# Patient Record
Sex: Male | Born: 1975 | Race: White | Hispanic: No | Marital: Single | Smoking: Never smoker
Health system: Southern US, Community
[De-identification: ages and names within clinical notes are randomized; demographics above are authoritative.]

## PROBLEM LIST (undated history)

## (undated) DIAGNOSIS — T8611 Kidney transplant rejection: Secondary | ICD-10-CM

## (undated) DIAGNOSIS — J9690 Respiratory failure, unspecified, unspecified whether with hypoxia or hypercapnia: Secondary | ICD-10-CM

## (undated) DIAGNOSIS — I10 Essential (primary) hypertension: Secondary | ICD-10-CM

## (undated) DIAGNOSIS — Q8781 Alport syndrome: Secondary | ICD-10-CM

## (undated) DIAGNOSIS — M25449 Effusion, unspecified hand: Secondary | ICD-10-CM

## (undated) DIAGNOSIS — Z8619 Personal history of other infectious and parasitic diseases: Secondary | ICD-10-CM

## (undated) DIAGNOSIS — E119 Type 2 diabetes mellitus without complications: Secondary | ICD-10-CM

## (undated) DIAGNOSIS — D631 Anemia in chronic kidney disease: Secondary | ICD-10-CM

## (undated) DIAGNOSIS — R7881 Bacteremia: Secondary | ICD-10-CM

## (undated) DIAGNOSIS — N189 Chronic kidney disease, unspecified: Secondary | ICD-10-CM

## (undated) DIAGNOSIS — M109 Gout, unspecified: Secondary | ICD-10-CM

## (undated) DIAGNOSIS — Z940 Kidney transplant status: Secondary | ICD-10-CM

## (undated) HISTORY — PX: KIDNEY TRANSPLANT: SHX239

## (undated) HISTORY — PX: NEPHRECTOMY TRANSPLANTED ORGAN: SUR880

---

## 2005-02-12 ENCOUNTER — Emergency Department: Payer: Self-pay | Admitting: Emergency Medicine

## 2005-10-22 ENCOUNTER — Emergency Department: Payer: Self-pay | Admitting: Emergency Medicine

## 2006-08-13 ENCOUNTER — Emergency Department: Payer: Self-pay | Admitting: Emergency Medicine

## 2008-11-26 ENCOUNTER — Ambulatory Visit: Payer: Self-pay

## 2008-12-04 ENCOUNTER — Ambulatory Visit: Payer: Self-pay

## 2011-01-04 DIAGNOSIS — T8611 Kidney transplant rejection: Secondary | ICD-10-CM | POA: Insufficient documentation

## 2012-01-26 DIAGNOSIS — E139 Other specified diabetes mellitus without complications: Secondary | ICD-10-CM | POA: Insufficient documentation

## 2012-01-26 DIAGNOSIS — I10 Essential (primary) hypertension: Secondary | ICD-10-CM | POA: Insufficient documentation

## 2012-05-06 ENCOUNTER — Ambulatory Visit: Payer: Self-pay | Admitting: Internal Medicine

## 2012-05-08 ENCOUNTER — Inpatient Hospital Stay: Payer: Self-pay | Admitting: Internal Medicine

## 2012-05-08 LAB — CBC
HCT: 24 % — ABNORMAL LOW (ref 40.0–52.0)
HGB: 8.4 g/dL — ABNORMAL LOW (ref 13.0–18.0)
MCH: 29.8 pg (ref 26.0–34.0)
MCV: 86 fL (ref 80–100)
RDW: 13.7 % (ref 11.5–14.5)
WBC: 5.4 10*3/uL (ref 3.8–10.6)

## 2012-05-08 LAB — COMPREHENSIVE METABOLIC PANEL
Albumin: 2.5 g/dL — ABNORMAL LOW (ref 3.4–5.0)
Calcium, Total: 8.4 mg/dL — ABNORMAL LOW (ref 8.5–10.1)
Chloride: 110 mmol/L — ABNORMAL HIGH (ref 98–107)
Co2: 24 mmol/L (ref 21–32)
EGFR (African American): 33 — ABNORMAL LOW
EGFR (Non-African Amer.): 28 — ABNORMAL LOW
Glucose: 118 mg/dL — ABNORMAL HIGH (ref 65–99)
Osmolality: 298 (ref 275–301)
Potassium: 3.9 mmol/L (ref 3.5–5.1)
SGOT(AST): 80 U/L — ABNORMAL HIGH (ref 15–37)
SGPT (ALT): 75 U/L (ref 12–78)
Sodium: 142 mmol/L (ref 136–145)

## 2012-05-09 LAB — URINALYSIS, COMPLETE
Bilirubin,UR: NEGATIVE
Glucose,UR: 50 mg/dL (ref 0–75)
Granular Cast: 1
Hyaline Cast: 4
Ketone: NEGATIVE
Nitrite: NEGATIVE
RBC,UR: 4 /HPF (ref 0–5)
Squamous Epithelial: NONE SEEN
WBC UR: 2 /HPF (ref 0–5)

## 2012-05-09 LAB — CBC WITH DIFFERENTIAL/PLATELET
Basophil %: 0.7 %
Eosinophil %: 0.4 %
Lymphocyte %: 6.7 %
MCH: 29.1 pg (ref 26.0–34.0)
MCV: 87 fL (ref 80–100)
Monocyte #: 0.4 x10 3/mm (ref 0.2–1.0)
Monocyte %: 6.7 %
Neutrophil #: 4.8 10*3/uL (ref 1.4–6.5)
Platelet: 84 10*3/uL — ABNORMAL LOW (ref 150–440)
WBC: 5.7 10*3/uL (ref 3.8–10.6)

## 2012-05-09 LAB — BASIC METABOLIC PANEL
BUN: 47 mg/dL — ABNORMAL HIGH (ref 7–18)
Calcium, Total: 8.5 mg/dL (ref 8.5–10.1)
Chloride: 110 mmol/L — ABNORMAL HIGH (ref 98–107)
EGFR (Non-African Amer.): 28 — ABNORMAL LOW
Osmolality: 298 (ref 275–301)
Potassium: 4.8 mmol/L (ref 3.5–5.1)

## 2012-05-09 LAB — PROTEIN / CREATININE RATIO, URINE
Creatinine, Urine: 149.5 mg/dL — ABNORMAL HIGH (ref 30.0–125.0)
Protein, Random Urine: 625 mg/dL — ABNORMAL HIGH (ref 0–12)
Protein/Creat. Ratio: 4181 mg/gCREAT — ABNORMAL HIGH (ref 0–200)

## 2012-05-10 LAB — RETICULOCYTES: Reticulocyte: 1.8 % (ref 0.7–2.5)

## 2012-05-10 LAB — CBC WITH DIFFERENTIAL/PLATELET
Bands: 42 %
Basophil %: 0.8 %
Comment - H1-Com1: NORMAL
Eosinophil #: 0 10*3/uL (ref 0.0–0.7)
Eosinophil %: 0.5 %
HGB: 6 g/dL — ABNORMAL LOW (ref 13.0–18.0)
Lymphocyte %: 12.3 %
Lymphocytes: 17 %
MCH: 26.9 pg (ref 26.0–34.0)
MCHC: 31.1 g/dL — ABNORMAL LOW (ref 32.0–36.0)
Monocyte #: 0.2 x10 3/mm (ref 0.2–1.0)
Monocyte %: 6.8 %
Monocytes: 3 %
Neutrophil %: 79.6 %
Platelet: 64 10*3/uL — ABNORMAL LOW (ref 150–440)
RBC: 2.21 10*6/uL — ABNORMAL LOW (ref 4.40–5.90)

## 2012-05-10 LAB — BASIC METABOLIC PANEL
Calcium, Total: 8 mg/dL — ABNORMAL LOW (ref 8.5–10.1)
Co2: 23 mmol/L (ref 21–32)
Creatinine: 3.88 mg/dL — ABNORMAL HIGH (ref 0.60–1.30)
EGFR (African American): 22 — ABNORMAL LOW
EGFR (Non-African Amer.): 19 — ABNORMAL LOW
Glucose: 132 mg/dL — ABNORMAL HIGH (ref 65–99)
Osmolality: 292 (ref 275–301)
Sodium: 138 mmol/L (ref 136–145)

## 2012-05-11 LAB — BASIC METABOLIC PANEL
Anion Gap: 10 (ref 7–16)
BUN: 53 mg/dL — ABNORMAL HIGH (ref 7–18)
Calcium, Total: 8.3 mg/dL — ABNORMAL LOW (ref 8.5–10.1)
Chloride: 106 mmol/L (ref 98–107)
Creatinine: 3.41 mg/dL — ABNORMAL HIGH (ref 0.60–1.30)
EGFR (Non-African Amer.): 22 — ABNORMAL LOW
Glucose: 144 mg/dL — ABNORMAL HIGH (ref 65–99)
Osmolality: 294 (ref 275–301)
Potassium: 4.7 mmol/L (ref 3.5–5.1)

## 2012-05-11 LAB — PROTIME-INR: INR: 1.1

## 2012-05-11 LAB — CBC WITH DIFFERENTIAL/PLATELET
Basophil %: 0.5 %
Eosinophil #: 0 10*3/uL (ref 0.0–0.7)
Lymphocyte #: 0.4 10*3/uL — ABNORMAL LOW (ref 1.0–3.6)
Lymphocyte %: 9.3 %
MCH: 28.7 pg (ref 26.0–34.0)
MCHC: 34.1 g/dL (ref 32.0–36.0)
MCV: 84 fL (ref 80–100)
Monocyte #: 0.3 x10 3/mm (ref 0.2–1.0)
Neutrophil %: 84 %
Platelet: 81 10*3/uL — ABNORMAL LOW (ref 150–440)
RBC: 2.42 10*6/uL — ABNORMAL LOW (ref 4.40–5.90)
RDW: 14 % (ref 11.5–14.5)

## 2012-05-11 LAB — LACTATE DEHYDROGENASE: LDH: 327 U/L — ABNORMAL HIGH (ref 85–241)

## 2012-05-11 LAB — FIBRINOGEN: Fibrinogen: 624 mg/dL — ABNORMAL HIGH (ref 210–470)

## 2012-05-11 LAB — FOLATE: Folic Acid: 48 ng/mL (ref 3.1–100.0)

## 2012-05-11 LAB — FIBRIN DEGRADATION PROD.(ARMC ONLY): Fibrin Degradation Prod.: <10 ug/ml (ref 2.1–7.7)

## 2012-05-12 DIAGNOSIS — N179 Acute kidney failure, unspecified: Secondary | ICD-10-CM | POA: Insufficient documentation

## 2012-05-12 DIAGNOSIS — J9600 Acute respiratory failure, unspecified whether with hypoxia or hypercapnia: Secondary | ICD-10-CM | POA: Insufficient documentation

## 2012-05-12 LAB — CBC WITH DIFFERENTIAL/PLATELET
Basophil #: 0 10*3/uL (ref 0.0–0.1)
Basophil %: 0.2 %
Eosinophil #: 0 10*3/uL (ref 0.0–0.7)
Eosinophil %: 0.1 %
HGB: 7.6 g/dL — ABNORMAL LOW (ref 13.0–18.0)
Lymphocyte #: 0.2 10*3/uL — ABNORMAL LOW (ref 1.0–3.6)
Lymphocyte %: 3.2 %
Monocyte #: 0.3 x10 3/mm (ref 0.2–1.0)
Neutrophil %: 92.3 %
RBC: 2.66 10*6/uL — ABNORMAL LOW (ref 4.40–5.90)
RDW: 14.1 % (ref 11.5–14.5)

## 2012-05-12 LAB — BASIC METABOLIC PANEL
Anion Gap: 10 (ref 7–16)
Calcium, Total: 8.2 mg/dL — ABNORMAL LOW (ref 8.5–10.1)
Chloride: 106 mmol/L (ref 98–107)
Co2: 22 mmol/L (ref 21–32)
Creatinine: 3.66 mg/dL — ABNORMAL HIGH (ref 0.60–1.30)
EGFR (African American): 23 — ABNORMAL LOW
EGFR (Non-African Amer.): 20 — ABNORMAL LOW
Glucose: 205 mg/dL — ABNORMAL HIGH (ref 65–99)
Potassium: 5.2 mmol/L — ABNORMAL HIGH (ref 3.5–5.1)
Sodium: 138 mmol/L (ref 136–145)

## 2012-05-13 DIAGNOSIS — D509 Iron deficiency anemia, unspecified: Secondary | ICD-10-CM | POA: Insufficient documentation

## 2012-05-13 LAB — EXPECTORATED SPUTUM ASSESSMENT W GRAM STAIN, RFLX TO RESP C

## 2012-05-18 LAB — CULTURE, BLOOD (SINGLE)

## 2012-06-05 ENCOUNTER — Ambulatory Visit: Payer: Self-pay | Admitting: Internal Medicine

## 2012-08-15 DIAGNOSIS — D649 Anemia, unspecified: Secondary | ICD-10-CM | POA: Insufficient documentation

## 2012-08-23 DIAGNOSIS — J9690 Respiratory failure, unspecified, unspecified whether with hypoxia or hypercapnia: Secondary | ICD-10-CM | POA: Insufficient documentation

## 2012-08-24 DIAGNOSIS — R031 Nonspecific low blood-pressure reading: Secondary | ICD-10-CM | POA: Insufficient documentation

## 2012-08-24 DIAGNOSIS — J9602 Acute respiratory failure with hypercapnia: Secondary | ICD-10-CM | POA: Insufficient documentation

## 2012-09-06 DIAGNOSIS — J15212 Pneumonia due to Methicillin resistant Staphylococcus aureus: Secondary | ICD-10-CM | POA: Insufficient documentation

## 2012-09-19 DIAGNOSIS — M25449 Effusion, unspecified hand: Secondary | ICD-10-CM | POA: Insufficient documentation

## 2012-09-19 DIAGNOSIS — M109 Gout, unspecified: Secondary | ICD-10-CM | POA: Insufficient documentation

## 2012-09-19 DIAGNOSIS — M1990 Unspecified osteoarthritis, unspecified site: Secondary | ICD-10-CM | POA: Insufficient documentation

## 2012-09-27 DIAGNOSIS — M659 Synovitis and tenosynovitis, unspecified: Secondary | ICD-10-CM | POA: Insufficient documentation

## 2013-02-05 DIAGNOSIS — R112 Nausea with vomiting, unspecified: Secondary | ICD-10-CM | POA: Insufficient documentation

## 2013-02-15 DIAGNOSIS — Q8781 Alport syndrome: Secondary | ICD-10-CM | POA: Insufficient documentation

## 2013-02-21 DIAGNOSIS — A419 Sepsis, unspecified organism: Secondary | ICD-10-CM | POA: Insufficient documentation

## 2013-02-23 DIAGNOSIS — R7881 Bacteremia: Secondary | ICD-10-CM | POA: Insufficient documentation

## 2013-02-23 DIAGNOSIS — A0472 Enterocolitis due to Clostridium difficile, not specified as recurrent: Secondary | ICD-10-CM | POA: Insufficient documentation

## 2013-06-07 ENCOUNTER — Emergency Department: Payer: Self-pay | Admitting: Internal Medicine

## 2013-06-07 LAB — CBC
HCT: 33.3 % — ABNORMAL LOW (ref 40.0–52.0)
MCH: 28.7 pg (ref 26.0–34.0)
MCHC: 33.2 g/dL (ref 32.0–36.0)
Platelet: 165 10*3/uL (ref 150–440)
RDW: 14.2 % (ref 11.5–14.5)
WBC: 8.6 10*3/uL (ref 3.8–10.6)

## 2013-06-07 LAB — BASIC METABOLIC PANEL
Calcium, Total: 9.9 mg/dL (ref 8.5–10.1)
Chloride: 102 mmol/L (ref 98–107)
Creatinine: 7.19 mg/dL — ABNORMAL HIGH (ref 0.60–1.30)
EGFR (Non-African Amer.): 9 — ABNORMAL LOW
Sodium: 137 mmol/L (ref 136–145)

## 2013-08-16 DIAGNOSIS — I10 Essential (primary) hypertension: Secondary | ICD-10-CM | POA: Insufficient documentation

## 2014-01-03 DIAGNOSIS — Z48298 Encounter for aftercare following other organ transplant: Secondary | ICD-10-CM | POA: Insufficient documentation

## 2014-01-03 DIAGNOSIS — Z940 Kidney transplant status: Secondary | ICD-10-CM | POA: Insufficient documentation

## 2014-10-23 NOTE — H&P (Signed)
PATIENT NAME:  Francisco Ruiz, Francisco Ruiz MR#:  [id number] DATE OF BIRTH:  [birth year]  DATE OF ADMISSION:  05/08/2012  PRIMARY CARE PHYSICIAN: He has no local doctor. He follows with [facility].   CHIEF COMPLAINT: Cough, fever, and generalized weakness.   HISTORY OF PRESENT ILLNESS: Mr. Schubert is a 39 year old pleasant Caucasian male with past medical history of kidney transplant who was in his usual state of health until about five days ago when he had symptom of cough that was mild. However, over the following several days it got worse, then associated with fever. In the last 24 hours he developed generalized weakness, dizziness, headache, temperature reaching 104, and the cough got worse. The patient came to the hospital for evaluation and findings here are consistent with the right upper lobe pneumonia. The patient was admitted for further evaluation and treatment.   REVIEW OF SYSTEMS:  CONSTITUTIONAL: Reports fever as above and chills, then developed fatigue as well. EYES: No blurring of vision. No double vision. No new visual changes. The patient has a history of cataract surgery. ENT: No sore throat. No dysphagia. The patient has moderate hearing loss. CARDIOVASCULAR: No chest pain, denied shortness of breath. No hemoptysis. RESPIRATORY: No chest pain. No shortness of breath. No hemoptysis but reports cough.  GASTROINTESTINAL: No abdominal pain. Has a little nausea. No vomiting, no diarrhea. GENITOURINARY: No dysuria. No frequency of urination. No hematuria. MUSCULOSKELETAL: No joint pain or swelling. No muscular pain or swelling.  INTEGUMENT: No skin rash. No ulcers. NEUROLOGIC: No focal weakness. No seizure activity, but reports some headache that started lately. PSYCH: No anxiety. No depression.  ENDOCRINE: No polyuria or polydipsia. No heat or cold intolerance.   PAST MEDICAL HISTORY:  1. 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   2. Systemic hypertension.  3. Gout.  4. Cataract.  5. History of gastroesophageal reflux disease.    PAST SURGICAL HISTORY:  1. Kidney transplant times two in 1994 and in 2004.  2. Bilateral cataract surgery.   FAMILY HISTORY: His mother also suffers from Alport syndrome and she had a kidney transplant. His father suffers from chronic obstructive pulmonary disease and diabetes mellitus. He underwent pacemaker and defibrillator implant.   SOCIAL HABITS: Nonsmoker. No history of alcohol or drug abuse.   SOCIAL HISTORY: He is single, works with the paramedics.   ADMISSION MEDICATIONS:  1. CellCept 750 mg twice a day.  2. Carvedilol 25 mg twice a day.  3. Allopurinol 100 mg once a day.  4. Lasix 20 mg twice a day.  5. Lisinopril 10 mg in the morning and 20 in the evening. 6. Prednisone 5 mg a day. 7. Prograf 3 mg twice a day.  8. Zantac 150 mg once a day p.r.n.   ALLERGIES: Penicillin causes skin rash, side effects from oxycodone.   PHYSICAL EXAMINATION:  VITAL SIGNS: Blood pressure 156/71, respiratory rate 20, pulse 86, temperature 101.3, oxygen saturation 95%.   GENERAL APPEARANCE: Young male laying in bed in no acute distress.   HEAD: No pallor. No icterus. No cyanosis.   EARS, NOSE, AND THROAT: Hearing is moderately impaired. Nasal mucosa, lips, and tongue were normal. No evidence of oral thrush.   EYES: Normal eyelids and conjunctivae. Pupils about 5 mm, equal, and reactive to light.   NECK: Supple. Trachea at midline. No thyromegaly. No cervical lymphadenopathy. No masses.   HEART: Normal S1, S2. No S3, S4. There is a systolic murmur  grade 2/6 at the left sternal border and aortic area. No carotid bruits.   LUNGS: Normal breathing pattern without use of accessory muscles. No rales. No wheezing. Few rhonchi in the right upper chest.   ABDOMEN: Soft without tenderness. No hepatosplenomegaly. No masses. No hernias.   SKIN: No ulcers. No subcutaneous nodules.    MUSCULOSKELETAL: No joint swelling. No clubbing.   NEUROLOGIC: Cranial nerves II through XII are intact. No focal motor deficit.   PSYCHIATRIC: The patient is alert and oriented times three. Mood and affect were normal.   LABORATORY, DIAGNOSTIC, AND RADIOLOGICAL DATA: Chest x-ray showed right upper lobe pneumonia. CBC showed white count 5400, hemoglobin 8.4, hematocrit 24, platelet count 99, MCV 86, MCH and MCHC were normal. Liver function tests showed protein of 6, albumin 2.5, AST 80, ALT 75, bilirubin 0.4, serum glucose 118, BUN 50, creatinine 2.7, sodium 142, potassium 3.9. Calcium 8.4.   ASSESSMENT:  1. Pneumonia.  2. Status post kidney transplant. 3. Alport syndrome.  4. Normocytic, normochromic anemia.  5. Thrombocytopenia.  6. Systemic hypertension.  7. Gout. 8. Heart murmur. The patient states that this is an old finding.  9. Gastroesophageal reflux disease.   PLAN: We will admit the patient to the medical floor. Blood cultures times two were taken. I would like to mention that the ER physician spoke with the transplant clinic at [facility] and they indicated that it is okay to admit the patient to [facility] and to treat him as usual with antibiotics. Levaquin was initiated. I will continue his home medications as listed above. For deep vein thrombosis prophylaxis, we will start heparin 5000 units twice a day until he is fully ambulatory. Should he require longer inpatient hospitalization, then we need to repeat a CBC to ensure stability of his platelet count.  Oxygen supplementation. Symptomatic treatment of his cough and fever.   TIME SPENT: Time spent evaluating this patient and reviewing medical records took more than 55 minutes.    ____________________________ Carney CornersAmir M. Rudene Rearwish, MD amd:bjt D: 05/08/2012 23:25:26 ET T: 05/09/2012 07:36:06 ET JOB#: [id number]  cc: Carney CornersAmir M. Rudene Rearwish, MD, <Dictator> Zollie ScaleAMIR M Markcus Lazenby MD ELECTRONICALLY SIGNED 05/09/2012  22:33

## 2014-10-23 NOTE — Consult Note (Signed)
PATIENT NAME:  Francisco Ruiz, Francisco K MR#:  [id number] DATE OF BIRTH:  [birth year]  DATE OF CONSULTATION:  05/10/2012  REFERRING PHYSICIAN:  Altamese DillingVaibhavkumar Vachhani, MD CONSULTING PHYSICIAN:  Rosalyn GessMichael E. Fernando Stoiber, MD  REASON FOR CONSULTATION: Pneumonia in a renal transplant.   HISTORY OF PRESENT ILLNESS: The patient is a 39 year old white man with a past history significant for Alport syndrome status post renal transplant in 1994 and retransplant in 2004 who was admitted with several-day history of cough that over the few days prior to admission became worse with headaches, dizziness, and generalized weakness. He also had some fevers but no chills or sweats. His cough was nonproductive and he has not noticed any rashes. He has not had any changes in his immunosuppression related to his transplant over the last two years. He did have an increase two years ago due to chronic nephropathy but a biopsy at that time showed no evidence for rejection. He was admitted to the hospital on 05/08/2012 and was started on ceftriaxone and azithromycin. He has clinically been doing better with improvement in his fever and his weakness. He has also been found to have chest x-ray with a right upper lobe infiltrate.   ALLERGIES: Penicillin and oxycodone.   PAST MEDICAL HISTORY:  1. Alport syndrome.  2. Renal transplant in 1994 who had evidence for  rejection and retransplant in 2004. He had no infectious issues related to his transplant in the early transplant period. 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 His immunosuppression was increased slightly approximately two years ago.  3. Hypertension.  4. Gout.  5. Cataracts.  6. Gastroesophageal reflux disease.   FAMILY HISTORY: Positive for Alport syndrome with renal transplant, chronic obstructive pulmonary disease, and diabetes.   SOCIAL HISTORY: The patient lives by himself. 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 He does not smoke. He does not drink. No injecting drug use. He has dogs at home, but no other animal exposure. He has had no recent travel.   REVIEW OF SYSTEMS: GENERAL: Positive fevers. No chills. No sweats. Positive generalized malaise. HEENT: Some headache. Some dizziness. No sinus congestion. No sore throat. NECK: No stiffness. No swollen glands. RESPIRATORY: Positive cough with no sputum production. Minimal shortness of breath. CARDIAC: No chest pains. No palpitations. No peripheral edema. GASTROINTESTINAL: Some nausea. No vomiting. No abdominal pain. No change in his bowels. GENITOURINARY: No change in his urine. MUSCULOSKELETAL: He has generalized myalgias but no frank arthritis. No recent gout flares. NEUROLOGIC: No focal weakness. SKIN: No rashes. PSYCHIATRIC: No complaints. All other systems were negative.   PHYSICAL EXAMINATION:   VITAL SIGNS: T-max 102.4, T-current 100.7, pulse 89, blood pressure 142/77, respirations 18, and 95% on 2 liters.   GENERAL: A 39 year old white man in no acute distress.   HEENT: Normocephalic, atraumatic. Pupils equal and reactive to light. Extraocular motion intact. Sclerae, conjunctivae, and lids are without evidence for emboli or petechiae. Oropharynx shows no erythema or exudate. Teeth and gums are in good condition.   NECK: Supple. Full range of motion. Midline trachea. No lymphadenopathy. No thyromegaly.   PULMONARY: Clear to auscultation bilaterally with the exception of some occasional crackles appreciated laterally. There was no focal consolidation. He is able to speak in full sentences.   CARDIAC: Regular rate and rhythm without murmur, rub, or gallop.   ABDOMEN: Soft, nontender, and nondistended. No hepatosplenomegaly. No hernia is noted.   EXTREMITIES: No evidence for tenosynovitis.  SKIN: No rashes. No stigmata of endocarditis, specifically no Janeway lesions nor Osler nodes.   NEUROLOGIC: The patient is moving all  four extremities and is awake and alert.   PSYCHIATRIC: Mood and affect appeared normal.   LABS/RADIOLOGIC STUDIES: BUN 52, creatinine 3.88, bicarbonate 23, and anion gap of 9. White count 3.5 with hemoglobin of 6.0, platelet count 64, ANC 2.8, and ALC 0.2. White count on admission was 5.4 with a hemoglobin 8.4 and platelet count of 99.   Blood cultures from admission show no growth.   Rapid influenza testing is negative.   Urinalysis had 50 mg/dL of protein, 2+ blood, greater than 500 protein, negative nitrites, negative leukocyte esterase, 4 red cells, 2 white cells, and no epithelial cells.   Chest x-ray from admission showed a right upper lobe infiltrate and interstitial findings with possible diffuse interstitial infiltrate versus evidence for reactive airway disease. Repeat chest x-ray on today showed persistent bilateral pneumonia.   IMPRESSION: A 39 year old white man with history of Alport syndrome with renal transplant in 1994 and in 2004 with chronic allograft nephropathy and CMV reactivation in 2004 who was admitted with pneumonia.   RECOMMENDATIONS:  1. Typical infections related to renal transplants are divided into three time periods: 1) Intermediately postoperative, 2) the first six months following transplant, and 3) greater than six months following transplant. He is in the latter group. Most of the infections in this period are usual community-acquired infections. There is an increased risk of lymphoproliferative disease related to Epstein-Barr virus as well as CMV-related infections. In cases of her ejection where the immunosuppression has been significantly increased, there can be more opportunistic infections as well. In his case, he had a mild increase in his immunosuppression two years ago, for his nephropathy, but has been stable since then. He is clinically improved on admission, although his chest x-ray looks somewhat worse two days later.  2. We will continue treatment  for community-acquired pneumonia. If he continues to improve we will consider changing to levofloxacin orally.  3. 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 He had CMV activation in 2004 and was treated for this. We will check a CMV/PCR and an Epstein-Barr virus PCR tomorrow.  4. Would not decrease in his baseline immunosuppression as he appears to be responding to antibiotics.  5. Hematology is to see him for his low blood counts. His most recent CBC on 08/12 showed a white count of 5.4 with a hemoglobin of 8.6 and a platelet count of 125. There was no differential associated with that. His white count has for the most part been in the normal range. His hemoglobin has ranged anywhere from 8.6 to 11.0 over the past year. His platelet count has ranged from 108 to 148.   This is a high-level infectious disease consult. Thank you very much for involving me in Mr. Whittier's care. ____________________________ Rosalyn Gess. Lettie Czarnecki, MD meb:slb D: 05/10/2012 16:43:10 ET T: 05/11/2012 08:12:18 ET JOB#: [id number]  cc: Rosalyn Gess. Teresita Fanton, MD, <Dictator> Earlie Arciga E Emmalie Haigh MD ELECTRONICALLY SIGNED 05/12/2012 12:25

## 2014-10-23 NOTE — Consult Note (Signed)
PATIENT NAME:  Leeanne RioBUCKNER, Takeo K MR#:  [id number] DATE OF BIRTH:  July 19, 1975  DATE OF CONSULTATION:  05/11/2012  REFERRING PHYSICIAN:  Altamese DillingVaibhavkumar Vachhani, MD CONSULTING PHYSICIAN:  Rose PhiPeter R. Kemper Durielarke, MD  HISTORY: Mr. 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 He was admitted 05/08/2012 with cough, fever, and generalized weakness, and found associated with right side pneumonia on chest x-ray. He is referred for evaluation of severe headache. History comes from the patient who is accompanied by his parents and from his hospital chart.   The patient can to the emergency room at 7:00 p.m. on 05/08/2012 for progressive problems associated with symptoms beginning Tuesday, 05/03/2012, when he had the onset of dry cough. He reports that he began having achiness starting Friday, 05/06/2012. On 05/07/2012 he began having fever up to 102.8 (decreased to 100.8 with Tylenol), worsening of "total body aches", chills, and onset of bilateral temporal area headache. On 05/08/2012, he stayed home all day and rested, but felt progressively worse including nausea, poor oral intake, feeling of dizziness with quality of poor balance more than lightheadedness, not better with lying down, and generalized weakness, with the decision at 6:30 p.m. to come to the emergency room. At the emergency room, initial vital signs included a blood pressure of 145/82 with heart rate 101, respirations 18, temperature 102.9, and saturation of 95%. With chest x-ray consistent with right upper lobe pneumonia, he was started on antibiotic treatment and IV fluids. Blood cultures were drawn and have shown no growth.   He was seen by Dr. Leavy CellaBlocker of infectious disease and Dr. Janese BanksSandeep  Pandit of hematology/oncology. He has been continued on treatment for apparent community-acquired pneumonia and has had worsening on follow-up chest x-ray, but overall some clinical improvement of his pulmonary status.   He has had recent worsening of headache which has spread more diffusely including posteriorly to the occipital area and to the posterior neck more than the right. Nausea has continued, at least when lying semisupine. He has required increasing doses of analgesia with Dilaudid required every 4 hours. At noon today, he was started on continuous IV treatment, PCA, with described moderate benefit. Brain MRI scan was performed today without contrast; this study was benign. PCR studies to screen Epstein-Barr virus and cytomegalovirus are pending.   He reports in the past no history of severe headaches or migraine headaches. He denies history of sinus headaches or history of sinus problems. He reports in general in the past he has seldom had headache. He has not had seizure, does not have history of meningitis, and has not had episodes in the past suggestive of stroke or temporary stroke.   PAST MEDICAL HISTORY: This is most notable for Alport's syndrome with end-stage renal disease and kidney transplant and being immunosuppressive therapy as noted above. He had kidney transplant in 1994 resulting in rejection and second kidney transplant in 2004. He has had bilateral cataract surgeries more than 10 years ago. He has history of hypertension, gout, and has reflux. He denies frank peptic ulcer disease. He is a nonsmoker and denies alcohol use. His regular medications include prednisone 5 mg a day, CellCept 250 mg twice a day, Prograf 1 mg twice a day, Carvedilol 25 mg twice a day, allopurinol 100 mg daily, and Lasix 20 mg twice a day,  lisinopril 10 mg in the morning and 20 mg in the evening, and Zantac 150 mg a day as needed.   PHYSICAL EXAMINATION: The patient is a well-developed, well-nourished  white gentleman who was examined initially lying semisupine, in no apparent distress, with blood pressure 130/75 and heart rate 82. There was no fever. He was examined seated on the side of the bed. He was normocephalic without evidence of trauma. His neck was supple with full forward flexion without report of change of headache or problems with neck discomfort; there was no meningismus. He denied any pain radiating down the back with head flexion. There was good range of motion to extension and to lateral rotation and bending.  He reported a little popping with rotation and bending movements, and then stated this was not new. He reported mild left posterior lateral neck aching discomfort with rotation of the head to the left and bending to the right. There was mild tenderness of the left posterolateral neck area.   Mental status was normal, although cognitive testing was not performed in detail. He was alert and fully oriented with clear speech and normal expression. He was lucid and a good historian with normal affect. There was mild decrease of hearing acuity bilaterally to finger rubs. Cranial nerve examination was otherwise symmetric and normal including facial sensation, facial appearance at rest and with directed movements in conversation, eye movements, and visual fields were full to finger count for each eye. Examination of the oropharynx showed normal midline uvula elevation with phonation, normal appearance of tongue, and normal tongue movements.   Motor examination of the extremities showed normal tone and bulk throughout and symmetric full power throughout. Extremity coordination was normal including hand and foot tapping movements, fine finger movements, and finger-to-nose movements. Extremity sensation was symmetric and normal including vibration perception of the great toes. Reflexes were symmetric and rated 2+ throughout. His gait was not tested.   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 His overall clinical picture raises concern about possible central nervous system infection in the setting of recent immunosuppressed status, and despite lack of meningismus on examination, no growth on blood cultures, and benign brain MRI scan results.   RECOMMENDATIONS:  1. Continue supportive care.  2. Trial of bolster cervical pillow for neck discomfort.  3. Follow-up on EB virus and CMV studies. 4. Hold on lumbar puncture today. I will follow-up on lab findings and clinical status in the morning.   I appreciate being asked to see this pleasant and interesting gentleman.  ____________________________ Rose Phi. Kemper Durie, MD prc:slb D: 05/12/2012 09:10:46 ET T: 05/12/2012 10:24:11 ET JOB#: [id number]  cc: Rose Phi. Kemper Durie, MD, <Dictator> Gaspar Garbe MD ELECTRONICALLY SIGNED 05/24/2012 13:12

## 2014-10-23 NOTE — Consult Note (Signed)
HEMATOLOGY followup note - oral cough and dyspnea on exertion same, continues on 2 L oxygen.  Denies bleeding symptoms.persistent headache.  Denies fever or chills todayresting in bed, alert and oriented, in no acute distress or tachypnea, on nasal cannula oxygen.           vitals - 98.8, 83, 18, 146/84, 94% on 2 L           HEENT: EOMs intact.  No oral thrush or petechia           Lungs: bilateral good air entry, no crepitations or rhonchi           Abdomen: soft, nontender: hemoglobin 7, platelets 81, WBC 4300, ANC 3600, creatinine 2.41, BUN 53.  LDH 327.  Ab-retic 0.039.  Tacrolimus level 5.0 (2 - 20).  INR 1.1, PTT 47.4, fibrinogen 624, FDP less than 10, TIBC 137, serum iron low at 18, iron saturation low at 13%.  B12 and folate normal.  39 year old gentleman with history of Alport syndrome status post kidney transplant x2 (second one was in 2004) who has been on immunosuppressive therapy with CellCept, Prograf and prednisone for many years now currently admitted with pneumonia, also having headaches. Patient is on broad-spectrum antibiotic coverage with Zithromax and Rocephin, clinically symptoms about the same, patient being planned for bronchoscopy to evaluation tomorrow given persistent bilateral infiltrates on chest x-ray of 11/5. Has thrombocytopenia and anemia likely secondary to cytopenias from chronic immunosuppressive therapy along with ongoing acute illness with pneumonia.  PTT slightly elevated but he has been on heparin yesterday, otherwise no definite evidence of DIC.  LDH minimally elevated, haptoglobin is pending, peripheral smear did not show increased schistocytes.  Has evidence of mild iron deficiency along with anemia of chronic disease given low TIBC, once stool Hemoccult is submitted recommend staron oral iron therapy. Also HIPA, haptoglobin, CMV PCR and EB virus PCR are pending at this time.  Creatinine is slightly better today, no bleeding symptoms. Agree with ongoing broad-spectrum  antibiotic coverage, patient is not neutropenic. Continue other supportive treatment. Monitor CBC daily and transfuse as indicated by labs and symptoms. If cytopenias significantly worsen then will need [facility] see if dose of immunosuppression can be reduced for the time being. Will continue to follow. Patient explained above, agreeable to this plan     Electronic Signatures: Izola PricePandit, Letonia Stead Raj (MD)  (Signed on 06-Nov-13 16:25)  Authored  Last Updated: 06-Nov-13 16:25 by Izola PricePandit, Broox Lonigro Raj (MD)

## 2014-10-23 NOTE — Consult Note (Signed)
Impression: 39yo WM w/ h/o Alport syndrome, renal transplant in 1994 and 2004 with chronic allograft nephropathy, CMV reactivation in 2004 admitted with pneumonia.  Typical infections related to renal transplants are divided into three time periods: immediate postop, first 6 months and >6 months.  He is in the latter group.  Most of the infections in this period are usual community acquired infections.  There is an increased risk of lymnphoproliferative diseases related to EBV and CMV related infections.  In cases of rejection where the immunosuppression has been significantly increased, there can be more opportunistic infections as well.  In his case, he had a mild increase in his immunosuppression two years ago for his nephropathy, but has been stable since.  He has clinically improved since admission. Will continue treatment for CAP.  If he improves, will change to levofloxacin orallyin am.   He was CMV negative with a CMV positive donor.  He had CMV activation in 2004 and was treated for this.  Will check CMV PCR and EBV PCR. Would not decrease his baseline immunosuppression as he appears to be responding appropriately to antibiotics. Hematology to see him for the low blood counts.    Electronic Signatures: Raniah Karan, Rosalyn GessMichael E (MD) (Signed on 05-Nov-13 16:32)  Authored   Last Updated: 05-Nov-13 16:42 by Lauraann Missey, Rosalyn GessMichael E (MD)

## 2014-10-23 NOTE — Discharge Summary (Signed)
PATIENT NAME:  Francisco Ruiz, Francisco Ruiz MR#:  [id number] DATE OF BIRTH:  09/25/1975  DATE OF ADMISSION:  05/08/2012 DATE OF DISCHARGE:  05/12/2012  DISPOSITION: Patient is being transferred possibly to [facility]. Please see the transfer summary for further details about accepting physician and the reason for transfer.   DIAGNOSES AT THE TIME OF TRANSFER:  1. Pneumonia, unknown organism. 2. 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   HISTORY OF PRESENT ILLNESS/HOSPITAL COURSE: This is a 39 year old Caucasian male with history of kidney transplant was in his usual state of health 3 to 4 days ago before presentation to the hospital and then he started having symptoms of cough which was mild, however, over next few days it got worse and associated with fever then he started feeling generalized weak, dizzy and headache. Temperature was using up to 104 degrees Fahrenheit and cough got worse so he came to the Emergency Room for further evaluation. In ER he was found having bilateral pneumonia on chest x-ray and he was admitted with community-acquired pneumonia, status post kidney transplant. He was initially started with Rocephin and Zithromax IV and nebulization treatment and oxygen supplementation with nasal cannula and he felt little better next day but then he had worsening of his headache. For that we had to finally involve infectious disease specialist and we got MRI of the brain which didn't show any infective pathology except mastoid air cell problem. Pain was so severe and it was associated with nausea that he was unable to maintain it with IV morphine injections p.r.n., so we finally started him on Dilaudid PCA drip. The rate of that was 0.4 mg every hour standard rate and 0.1 mg as patient requested maximum dose for four hours was allowed 2.4 mg. He felt with this regimen little better in his headache. ID agreed with current management of Rocephin and Zithromax. Will  also involved pulmonary care specialist and the plan was to do bronchoscopy next day morning but in the middle of the night he had acute worsening of his shortness of breath and rapid response was called in. He was drowsy and his saturation around 60 at that time. Narcan was given by the on call physician and he had some response, he became more alert but his saturation did not improve and he was started on BiPAP with 100% oxygenation. His oxygenation improved up to 85% to 90%. Chest x-ray repeat showed worsening of his bilateral pneumonia alveolar pattern and so he was transferred to medical Intensive Care Unit. He was throughout the hospital stay hemodynamically stable, his blood pressure and heart rate remained stable and he never needed any vasopressors or excessive IV fluid to maintain them. Family were requesting due to his worsening of his medical status that we speak to his nephrologist who is managing him after kidney transplantation. We spoke to him and Dr. Sofie Hartigan. He is nephrologist. He agreed to accept him in [facility] and we spoke to transfer center and finally spoke to Intensive Care Unit on call specialist in [facility]. He agreed to accept the patient for further management because of his complicated issues with kidney transplant, immunosuppression and specialists were at [facility]   LABORATORY, DIAGNOSTIC AND RADIOLOGICAL DATA: Important lab results during the hospital stay. There are many laboratory tests done to find out the source of his infection and type of pathogen causing his pneumonia. All the results are to be accompanied while transferring to [facility] and I informed the nurse in  charge in the Intensive Care Unit and care management about this thing. Please see those results on transfer.  CURRENT MEDICATIONS: 1. Allopurinol tablet 100 mg orally. 2. Carvedilol 25 mg b.i.d.  3. Folic acid 1 mg orally daily. 4. Meropenem 1 gram IV every 12 hours. 5. CellCept  750 mg oral b.i.d.  6. Posaconazole 200 mg/5 mL 200 mg oral every six hours. 7. Tacrolimus 3 mg oral b.i.d.  8. Albuterol and ipratropium inhaler every four hours. 9. Precedex 400 mcg per normal saline 100 mL drip at the rate of 0.2 mcg/kg per hour.  10. Hydromorphone 2 mg IV push every four hours p.r.n. for pain.   CONDITION ON DISCHARGE: Critical.  CODE STATUS: FULL CODE.   INSTRUCTIONS ON DISCHARGE: He is being [facility] whenever bed is available in Intensive Care Unit. Please follow further recommendations by Int[facility] Care Unit team in [facility] for management.   TOTAL TIME SPENT ON THIS DISCHARGE PROCESS: 80 minutes.  ____________________________ Francisco PigeonVaibhavkumar G. Elisabeth PigeonVachhani, MD vgv:cms D: 05/12/2012 16:06:58 ET T: 05/12/2012 16:27:52 ET  JOB#: [id number] Verdella Laidlaw MD ELECTRONICALLY SIGNED 05/24/2012 22:13

## 2014-10-23 NOTE — Consult Note (Signed)
PATIENT NAME:  Francisco Ruiz, Francisco Ruiz MR#:  [id number] DATE OF BIRTH:  [birth year]  DATE OF CONSULTATION:  05/10/2012  REFERRING PHYSICIAN:  Dr. Elisabeth Pigeon  CONSULTING PHYSICIAN:  Griffey Nicasio R. Sherrlyn Hock, MD  REASON FOR CONSULTATION: Pancytopenia status post renal transplant on immunosuppressive medication.   HISTORY OF PRESENT ILLNESS: Patient is a 39 year old Caucasian gentleman who has been admitted to hospital on 11/03 with complaints of fevers, chills, progressive cough and dyspnea. Was found to have bilateral pneumonia and is currently on IV azithromycin, IV Rocephin. 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 More recently patient states that he was found to have proteinuria but was not felt to have a rejection and dose of immunosuppressive medication was increased, currently he takes CellCept 750 mg twice daily, Prograf 3 mg twice daily, prednisone 5 mg daily.   Clinically, states that he is beginning to feel slightly better overall, cough is still present but improving. Still has low grade fevers but denies any chills. Eating still poor. CBC upon admission on 11/03 showed hemoglobin 8.4, platelets 99,000, WBC 5400, creatinine 2.78. Blood cultures remain negative so far, influenza test is negative. CBC today shows worsening hemoglobin down to 6.0, MCV of 86, WBC 3500 with ANC 2800, platelet count 164,000. Patient clinically denies any obvious bleeding symptoms.   PAST MEDICAL/SURGICAL HISTORY: As in history of present illness above.   FAMILY HISTORY: Remarkable for Alport syndrome in mother. Otherwise remarkable for diabetes, chronic obstructive pulmonary disease.   SOCIAL HISTORY: Denies smoking, history of alcohol or recreational drug  usage. Works with paramedics.   HOME MEDICATIONS:  1. CellCept 750 mg twice daily.  2. Carvedilol 25 mg twice daily.  3. Allopurinol 100 mg once daily.  4. Lasix 20 mg b.i.d.  5. Lisinopril 10 mg q.a.m. and 20 mg q.p.m.  6. Prednisone 5 mg daily.  7. Prograf 3 mg b.i.d.  8. Zantac 150 mg daily p.r.n.   ALLERGIES: Penicillin and oxycodone.   REVIEW OF SYSTEMS: CONSTITUTIONAL: As in history of present illness. Has generalized weakness and dyspnea on exertion. Having fevers. No night sweats. HEENT: Denies any headaches, dizziness, epistaxis, ear or jaw pain. No new sinus symptoms. CARDIAC: No angina, palpitation, orthopnea, or paroxysmal nocturnal dyspnea. LUNGS: As in history of present illness. No hemoptysis. No chest pain today. GASTROINTESTINAL: No nausea, vomiting, or diarrhea. No bright red blood in stools or melena. GENITOURINARY: No dysuria or hematuria. HEMATOLOGIC: No obvious bleeding symptoms. SKIN: No new rashes or pruritus. MUSCULOSKELETAL: No new bone pains. EXTREMITIES: No new swelling or pain. NEUROLOGIC: No new focal weakness, seizures, or loss of consciousness. No paresthesias in extremities. ENDOCRINE: No polyuria or polydipsia.   PHYSICAL EXAMINATION:  GENERAL: Patient is sitting in bed, on nasal cannula oxygen, tired looking, otherwise alert, oriented x4 and converses appropriately. No acute distress at rest. No tachypnea. No icterus. Pallor present.   VITAL SIGNS: Temperature 100.7, pulse 89, respiratory rate 18, blood pressure 142/77, 95% on 2 liters.   HEENT: Normocephalic, atraumatic. Extraocular movements intact. Sclera anicteric. No oral thrush.   NECK: Negative for lymphadenopathy.  CARDIOVASCULAR: S1, S2, regular rate and rhythm.   LUNGS: Lungs show bilateral good air entry, no crepitations or rhonchi.   ABDOMEN: Soft, nontender. No hepatosplenomegaly clinically.   EXTREMITIES: No major edema or cyanosis.  SKIN: No generalized rashes or bruising.    NEUROLOGIC: Limited examination. Cranial nerves intact. Moves all extremities spontaneously.   MUSCULOSKELETAL: No obvious joint deformity or swelling.   LABORATORY, DIAGNOSTIC, AND RADIOLOGICAL DATA: WBC 2500, hemoglobin 6.0, platelets 64, ANC 2800, 42% bands, creatinine up to 3.88, BUN 52, potassium 4.3, calcium 8.0. Influenza test negative. Chest x-ray on 11/03 showed right upper lobe infiltrate, interstitial findings, possibly component of reactive airway disease versus diffuse interstitial infiltrate.   IMPRESSION AND RECOMMENDATION: 39 year old gentleman with history of Alport syndrome status post kidney transplant x2 (second one was in 2004) who has been on immunosuppressive therapy with CellCept, Prograf and prednisone for many years now currently admitted with fever, cough, chills, with chest x-ray consistent with pneumonia. Patient is on broad-spectrum antibiotic coverage with Zithromax and Rocephin, clinically symptoms are just beginning to improve. Patient found to have persistent thrombocytopenia and anemia, both have progressed since admission. Per patient report, states that baseline thrombocytopenia has been around 120,000 a few months ago [facility], also has chronic anemia but has not been this low. He has currently received packed red blood cell transfusion today. Review of peripheral smear does not show increased schistocytes. 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 Will get additional work-up including reticulocyte count, manual differential, iron study, B12 and folate level, LDH and haptoglobin to look for evidence of TTP, PT/PTT/fibrinogen/FDP to look for evidence of DIC/coagulopathy. Also CMV PCR and EB virus PCR have been ordered by Dr. Leavy CellaBlocker. Agree with ongoing broad-spectrum  antibiotic coverage, patient is not neutropenic. Continue other supportive treatment. Monitor CBC daily and transfuse as indicated by labs and symptoms. Will continue to follow. If cytopenias continue to significantly worsen then will need to discuss with [facility] nephrologist at [facility] to see if dose of immunosuppression can be reduced for the time being. Will continue to follow. Patient explained above, agreeable to this plan.   Thank you for the referral. Please feel free to contact me if any additional questions.   ____________________________ Maren ReamerSandeep R. Sherrlyn HockPandit, MD srp:cms D: 05/11/2012 00:57:19 ET T: 05/11/2012 05:22:50 ET JOB#: [id number]  cc: Cesario Weidinger R. Sherrlyn HockPandit, MD, <Dictator> Wille CelesteSANDEEP R Choya Tornow MD ELECTRONICALLY SIGNED 05/11/2012 13:49

## 2015-02-07 ENCOUNTER — Encounter: Payer: Self-pay | Admitting: Family Medicine

## 2015-02-07 ENCOUNTER — Other Ambulatory Visit (INDEPENDENT_AMBULATORY_CARE_PROVIDER_SITE_OTHER): Payer: PRIVATE HEALTH INSURANCE

## 2015-02-07 ENCOUNTER — Ambulatory Visit (INDEPENDENT_AMBULATORY_CARE_PROVIDER_SITE_OTHER): Payer: PRIVATE HEALTH INSURANCE | Admitting: Family Medicine

## 2015-02-07 VITALS — BP 124/80 | HR 64 | Ht 69.0 in | Wt 209.0 lb

## 2015-02-07 DIAGNOSIS — M899 Disorder of bone, unspecified: Secondary | ICD-10-CM | POA: Insufficient documentation

## 2015-02-07 DIAGNOSIS — M25511 Pain in right shoulder: Secondary | ICD-10-CM

## 2015-02-07 NOTE — Progress Notes (Signed)
[facility] [address] [place], [place] [address] Phone: [phone] Subjective:    CC: right shoulder pain.   BJY:NWGNFAOZHY Francisco Ruiz is a 39 y.o. male coming in with complaint of right shoulder pain. Patient is had this pain intermittently for quite some time. Patient only notices it when he does some different exercises like heavy lifting. Does not affect daily activities. Rates the severity of pain a 5 out of 10. Does not keep him up at night but sometimes has been uncomfortable at night. Denies any radiation down the arm or any weakness. Patient's past medical history is significant for 3 to need transplants and is on chronic prednisone.    No past medical history on file.  Patient Active Problem List   Diagnosis Date Noted  . Aftercare following organ transplant 01/03/2014  . H/O kidney transplant 01/03/2014  . Essential (primary) hypertension 08/16/2013  . Bacteremia associated with intravascular line 02/23/2013  . C. difficile colitis 02/23/2013  . Bacteremia 02/23/2013  . Blood infection 02/21/2013  . 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 N&V (nausea and vomiting) 02/05/2013  . Synovitis or tenosynovitis of wrist 09/27/2012  . Arthritis 09/19/2012  . Gout 09/19/2012  . Effusion of hand joint 09/19/2012  . Pneumonia due to methicillin resistant Staphylococcus aureus 09/06/2012  . Acute respiratory failure with hypercapnia 08/24/2012  . Arterial blood pressure decreased 08/24/2012  . Failure respiratory 08/23/2012  . Absolute anemia 08/15/2012  . Anemia, iron deficiency 05/13/2012  . Acute kidney failure 05/12/2012  . Acute respiratory failure 05/12/2012  . BP (high blood pressure) 01/26/2012  . DM (diabetes mellitus), secondary 01/26/2012  . 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 History  Substance Use Topics  . Smoking status: Never Smoker   . Smokeless tobacco: Not on file  .  Alcohol Use: Not on file   Allergies  Allergen Reactions  . Penicillins Rash  . Mycophenolate Itching  . Oxycodone Itching   No family history on file.   Past medical history, social, surgical and family history all reviewed in electronic medical record.   Review of Systems: No headache, visual changes, nausea, vomiting, diarrhea, constipation, dizziness, abdominal pain, skin rash, fevers, chills, night sweats, weight loss, swollen lymph nodes, body aches, joint swelling, muscle aches, chest pain, shortness of breath, mood changes.   Objective Blood pressure 124/80, pulse 64, height 5\' 9"  (1.753 m), weight 209 lb (94.802 kg), SpO2 97 %.  General: No apparent distress alert and oriented x3 mood and affect normal, dressed appropriately.  HEENT: Pupils equal, extraocular movements intact  Respiratory: Patient's speak in full sentences and does not appear short of breath  Cardiovascular: No lower extremity edema, non tender, no erythema  Skin: Warm dry intact with no signs of infection or rash on extremities or on axial skeleton.  Abdomen: Soft nontender  Neuro: Cranial nerves II through XII are intact, neurovascularly intact in all extremities with 2+ DTRs and 2+ pulses.  Lymph: No lymphadenopathy of posterior or anterior cervical chain or axillae bilaterally.  Gait normal with good balance and coordination.  MSK:  Non tender with full range of motion and good stability and symmetric strength and tone of  elbows, wrist, hip, knee and ankles bilaterally.  Shoulder: Right Inspection reveals no abnormalities, atrophy or asymmetry. Palpation is normal with no tenderness over AC joint or bicipital groove. ROM is full in all planes passively. Rotator cuff strength normal throughout. Minimal  signs of impingement Speeds and Yergason's tests normal. No labral pathology noted with negative Obrien's, negative clunk and good stability. Severe asymmetry of the right scapula with dyskinesia. No  painful arc and no drop arm sign. No apprehension sign  MSK US performed of: Right This study was ordered, performed, and interpreted by Terrilee Files D.O.  Shoulder:   Supraspinatus: Mild calcific changes noted of the supraspinatus at insertion. No significant hypoechoic changes noted. Infraspinatus:  Appears normal on long and transverse views. Significant increase in Doppler flow Subscapularis:  Appears normal on long and transverse views. Positive bursa Teres Minor:  Appears normal on long and transverse views. AC joint:  Capsule undistended, no geyser sign. Glenohumeral Joint:  Appears normal without effusion. Glenoid Labrum:  Intact without visualized tears. Biceps Tendon:  Appears normal on long and transverse views, no fraying of tendon, tendon located in intertubercular groove, no subluxation with shoulder internal or external rotation.  Impression: Minimal calcific changes of the supraspinatus otherwise unremarkable  Procedure note 97110; 15 minutes spent for Therapeutic exercises as stated in above notes.  This included exercises focusing on stretching, strengthening, with significant focus on eccentric aspects.  Shoulder Exercises that included:  Basic scapular stabilization to include adduction and depression of scapula Scaption, focusing on proper movement and good control Internal and External rotation utilizing a theraband, with elbow tucked at side entire time Rows with theraband   Proper technique shown and discussed handout in great detail with ATC.  All questions were discussed and answered.      Impression and Recommendations:     This case required medical decision making of moderate complexity.

## 2015-02-07 NOTE — Progress Notes (Signed)
Pre visit review using our clinic review tool, if applicable. No additional management support is needed unless otherwise documented below in the visit note. 

## 2015-02-07 NOTE — Patient Instructions (Addendum)
Good to see you.  Ice 20 minutes at night Vitamin D 2000 IU  Exercises 3 times a week.  Stand on wall with heels butt shoulder and head touching for a goal of 5 minutes daily See me again in 3-4 weeks.   Standing:  Secure a rubber exercise band/tubing so that it is at the height of your shoulders when you are either standing or sitting on a firm arm-less chair.  Grasp an end of the band/tubing in each hand and have your palms face each other. Straighten your elbows and lift your hands straight in front of you at shoulder height. Step back away from the secured end of band/tubing until it becomes tense.  Squeeze your shoulder blades together. Keeping your elbows locked and your hands at shoulder-height, bring your hands out to your side.  Hold __________ seconds. Slowly ease the tension on the band/tubing as you reverse the directions and return to the starting position. Repeat __________ times. Complete this exercise __________ times per day. STRENGTH - Scapular Retractors  Secure a rubber exercise band/tubing so that it is at the height of your shoulders when you are either standing or sitting on a firm arm-less chair.  With a palm-down grip, grasp an end of the band/tubing in each hand. Straighten your elbows and lift your hands straight in front of you at shoulder height. Step back away from the secured end of band/tubing until it becomes tense.  Squeezing your shoulder blades together, draw your elbows back as you bend them. Keep your upper arm lifted away from your body throughout the exercise.  Hold __________ seconds. Slowly ease the tension on the band/tubing as you reverse the directions and return to the starting position. Repeat __________ times. Complete this exercise __________ times per day. STRENGTH - Shoulder Extensors   Secure a rubber exercise band/tubing so that it is at the height of your shoulders when you are either standing or sitting on a firm arm-less  chair.  With a thumbs-up grip, grasp an end of the band/tubing in each hand. Straighten your elbows and lift your hands straight in front of you at shoulder height. Step back away from the secured end of band/tubing until it becomes tense.  Squeezing your shoulder blades together, pull your hands down to the sides of your thighs. Do not allow your hands to go behind you.  Hold for __________ seconds. Slowly ease the tension on the band/tubing as you reverse the directions and return to the starting position. Repeat __________ times. Complete this exercise __________ times per day.  STRENGTH - Scapular Retractors and External Rotators  Secure a rubber exercise band/tubing so that it is at the height of your shoulders when you are either standing or sitting on a firm arm-less chair.  With a palm-down grip, grasp an end of the band/tubing in each hand. Bend your elbows 90 degrees and lift your elbows to shoulder height at your sides. Step back away from the secured end of band/tubing until it becomes tense.  Squeezing your shoulder blades together, rotate your shoulder so that your upper arm and elbow remain stationary, but your fists travel upward to head-height.  Hold __________ for seconds. Slowly ease the tension on the band/tubing as you reverse the directions and return to the starting position. Repeat __________ times. Complete this exercise __________ times per day.  STRENGTH - Scapular Retractors and External Rotators, Rowing  Secure a rubber exercise band/tubing so that it is at the height of your  shoulders when you are either standing or sitting on a firm arm-less chair.  With a palm-down grip, grasp an end of the band/tubing in each hand. Straighten your elbows and lift your hands straight in front of you at shoulder height. Step back away from the secured end of band/tubing until it becomes tense.  Step 1: Squeeze your shoulder blades together. Bending your elbows, draw your hands  to your chest as if you are rowing a boat. At the end of this motion, your hands and elbow should be at shoulder-height and your elbows should be out to your sides.  Step 2: Rotate your shoulder to raise your hands above your head. Your forearms should be vertical and your upper-arms should be horizontal.  Hold for __________ seconds. Slowly ease the tension on the band/tubing as you reverse the directions and return to the starting position. Repeat __________ times. Complete this exercise __________ times per day.  STRENGTH - Scapular Retractors and Elevators  Secure a rubber exercise band/tubing so that it is at the height of your shoulders when you are either standing or sitting on a firm arm-less chair.  With a thumbs-up grip, grasp an end of the band/tubing in each hand. Step back away from the secured end of band/tubing until it becomes tense.  Squeezing your shoulder blades together, straighten your elbows and lift your hands straight over your head.  Hold for __________ seconds. Slowly ease the tension on the band/tubing as you reverse the directions and return to the starting position. Repeat __________ times. Complete this exercise __________ times per day.  Document Released: 06/22/2005 Document Revised: 09/14/2011 Document Reviewed: 10/04/2008 [facility] Patient Information 2015 [place], [place]. This information is not intended to replace advice given to you by your health care provider. Make sure you discuss any questions you have with your health care provider.

## 2015-02-07 NOTE — Assessment & Plan Note (Signed)
Patient does have some dyskinesia. We discussed home exercises and postural changes that could be beneficial. Patient work with Event organiser today. Patient given a trial of topical anti-inflammatory to try. We discussed icing regimen. Patient did have some mild calcific changes of the rotator cuff but I do not think that this is causing any significant discomfort based on patient's symptoms. Patient will try these interventions and come back again in 3-4 weeks for further evaluation and treatment.

## 2015-02-08 ENCOUNTER — Other Ambulatory Visit: Payer: Self-pay

## 2015-02-08 MED ORDER — HYDROXYZINE HCL 25 MG PO TABS: ORAL_TABLET | ORAL | Status: DC

## 2015-02-28 ENCOUNTER — Ambulatory Visit: Payer: PRIVATE HEALTH INSURANCE | Admitting: Family Medicine

## 2015-02-28 DIAGNOSIS — Z0289 Encounter for other administrative examinations: Secondary | ICD-10-CM

## 2015-04-09 ENCOUNTER — Other Ambulatory Visit: Payer: Self-pay

## 2015-04-09 ENCOUNTER — Other Ambulatory Visit: Payer: Self-pay | Admitting: Family Medicine

## 2015-04-11 LAB — COMPREHENSIVE METABOLIC PANEL
ALT: 82 IU/L — AB (ref 0–44)
AST: 90 IU/L — ABNORMAL HIGH (ref 0–40)
Albumin/Globulin Ratio: 1.8 (ref 1.1–2.5)
Albumin: 4.1 g/dL (ref 3.5–5.5)
Alkaline Phosphatase: 108 IU/L (ref 39–117)
BUN/Creatinine Ratio: 33 — ABNORMAL HIGH (ref 8–19)
BUN: 50 mg/dL — AB (ref 6–20)
Bilirubin Total: 0.2 mg/dL (ref 0.0–1.2)
CALCIUM: 9.9 mg/dL (ref 8.7–10.2)
CO2: 23 mmol/L (ref 18–29)
CREATININE: 1.53 mg/dL — AB (ref 0.76–1.27)
Chloride: 101 mmol/L (ref 97–108)
GFR, EST AFRICAN AMERICAN: 66 mL/min/{1.73_m2} (ref >59)
GFR, EST NON AFRICAN AMERICAN: 57 mL/min/{1.73_m2} — AB (ref >59)
GLUCOSE: 206 mg/dL — AB (ref 65–99)
Globulin, Total: 2.3 g/dL (ref 1.5–4.5)
Potassium: 4.8 mmol/L (ref 3.5–5.2)
Sodium: 137 mmol/L (ref 134–144)
TOTAL PROTEIN: 6.4 g/dL (ref 6.0–8.5)

## 2015-04-11 LAB — CBC WITH DIFFERENTIAL/PLATELET
Basophils Absolute: 0 10*3/uL (ref 0.0–0.2)
Basos: 1 %
EOS (ABSOLUTE): 0 10*3/uL (ref 0.0–0.4)
Eos: 1 %
HEMOGLOBIN: 11.9 g/dL — AB (ref 12.6–17.7)
Hematocrit: 36.2 % — ABNORMAL LOW (ref 37.5–51.0)
IMMATURE GRANS (ABS): 0 10*3/uL (ref 0.0–0.1)
Immature Granulocytes: 0 %
LYMPHS: 18 %
Lymphocytes Absolute: 0.7 10*3/uL (ref 0.7–3.1)
MCH: 29 pg (ref 26.6–33.0)
MCHC: 32.9 g/dL (ref 31.5–35.7)
MCV: 88 fL (ref 79–97)
MONOCYTES: 8 %
Monocytes Absolute: 0.3 10*3/uL (ref 0.1–0.9)
NEUTROS ABS: 2.9 10*3/uL (ref 1.4–7.0)
Neutrophils: 72 %
Platelets: 166 10*3/uL (ref 150–379)
RBC: 4.11 x10E6/uL — ABNORMAL LOW (ref 4.14–5.80)
RDW: 13.3 % (ref 12.3–15.4)
WBC: 4 10*3/uL (ref 3.4–10.8)

## 2015-04-11 LAB — PHOSPHORUS: Phosphorus: 2.4 mg/dL — ABNORMAL LOW (ref 2.5–4.5)

## 2015-04-11 LAB — GAMMA GT: GGT: 111 IU/L — ABNORMAL HIGH (ref 0–65)

## 2015-04-11 LAB — MAGNESIUM: MAGNESIUM: 2 mg/dL (ref 1.6–2.3)

## 2015-04-13 LAB — TACROLIMUS LEVEL: TACROLIMUS LVL: 7.5 ng/mL (ref 2.0–20.0)

## 2015-04-13 LAB — SPECIMEN STATUS REPORT

## 2015-04-19 ENCOUNTER — Other Ambulatory Visit: Payer: Self-pay

## 2015-04-19 DIAGNOSIS — D899 Disorder involving the immune mechanism, unspecified: Secondary | ICD-10-CM

## 2015-04-21 LAB — CBC WITH DIFFERENTIAL
Basophils Absolute: 0 10*3/uL (ref 0.0–0.2)
Basos: 1 %
EOS (ABSOLUTE): 0.1 10*3/uL (ref 0.0–0.4)
EOS: 2 %
HEMATOCRIT: 39.3 % (ref 37.5–51.0)
HEMOGLOBIN: 12.8 g/dL (ref 12.6–17.7)
Immature Grans (Abs): 0 10*3/uL (ref 0.0–0.1)
Immature Granulocytes: 0 %
LYMPHS ABS: 0.8 10*3/uL (ref 0.7–3.1)
Lymphs: 24 %
MCH: 28.6 pg (ref 26.6–33.0)
MCHC: 32.6 g/dL (ref 31.5–35.7)
MCV: 88 fL (ref 79–97)
MONOCYTES: 8 %
Monocytes Absolute: 0.3 10*3/uL (ref 0.1–0.9)
NEUTROS ABS: 2.3 10*3/uL (ref 1.4–7.0)
Neutrophils: 65 %
RBC: 4.48 x10E6/uL (ref 4.14–5.80)
RDW: 13.9 % (ref 12.3–15.4)
WBC: 3.5 10*3/uL (ref 3.4–10.8)

## 2015-04-21 LAB — COMPREHENSIVE METABOLIC PANEL
ALBUMIN: 4.3 g/dL (ref 3.5–5.5)
ALT: 19 IU/L (ref 0–44)
AST: 20 IU/L (ref 0–40)
Albumin/Globulin Ratio: 2 (ref 1.1–2.5)
Alkaline Phosphatase: 82 IU/L (ref 39–117)
BUN / CREAT RATIO: 19 (ref 8–19)
BUN: 33 mg/dL — AB (ref 6–20)
Bilirubin Total: 0.3 mg/dL (ref 0.0–1.2)
CALCIUM: 9.9 mg/dL (ref 8.7–10.2)
CO2: 22 mmol/L (ref 18–29)
CREATININE: 1.77 mg/dL — AB (ref 0.76–1.27)
Chloride: 98 mmol/L (ref 97–108)
GFR calc non Af Amer: 48 mL/min/{1.73_m2} — ABNORMAL LOW (ref >59)
GFR, EST AFRICAN AMERICAN: 55 mL/min/{1.73_m2} — AB (ref >59)
GLUCOSE: 179 mg/dL — AB (ref 65–99)
Globulin, Total: 2.2 g/dL (ref 1.5–4.5)
Potassium: 4.8 mmol/L (ref 3.5–5.2)
Sodium: 140 mmol/L (ref 134–144)
TOTAL PROTEIN: 6.5 g/dL (ref 6.0–8.5)

## 2015-04-21 LAB — GAMMA GT: GGT: 45 IU/L (ref 0–65)

## 2015-04-21 LAB — TACROLIMUS LEVEL: Tacrolimus Lvl: 10.2 ng/mL (ref 2.0–20.0)

## 2015-04-21 LAB — MAGNESIUM: Magnesium: 1.8 mg/dL (ref 1.6–2.3)

## 2015-04-23 ENCOUNTER — Telehealth: Payer: Self-pay | Admitting: Physician Assistant

## 2015-04-23 NOTE — Telephone Encounter (Signed)
error 

## 2015-04-23 NOTE — Progress Notes (Signed)
Contacted Patient informed him of lab result were e-mailed to him.  Also [facility]  S.Manson PasseyBrown

## 2015-05-01 LAB — LIPID PANEL
CHOL/HDL RATIO: 7.4 ratio — AB (ref 0.0–5.0)
Cholesterol, Total: 185 mg/dL (ref 100–199)
HDL: 25 mg/dL — ABNORMAL LOW (ref >39)
LDL CALC: 135 mg/dL — AB (ref 0–99)
TRIGLYCERIDES: 124 mg/dL (ref 0–149)
VLDL Cholesterol Cal: 25 mg/dL (ref 5–40)

## 2015-05-01 LAB — SPECIMEN STATUS REPORT

## 2015-05-11 ENCOUNTER — Encounter: Payer: Self-pay | Admitting: Emergency Medicine

## 2015-05-11 ENCOUNTER — Emergency Department
Admission: EM | Admit: 2015-05-11 | Discharge: 2015-05-11 | Disposition: A | Discharge status: Home / Self Care | Payer: PRIVATE HEALTH INSURANCE | Attending: Emergency Medicine | Admitting: Emergency Medicine

## 2015-05-11 DIAGNOSIS — Z7982 Long term (current) use of aspirin: Secondary | ICD-10-CM | POA: Diagnosis not present

## 2015-05-11 DIAGNOSIS — Z79899 Other long term (current) drug therapy: Secondary | ICD-10-CM | POA: Insufficient documentation

## 2015-05-11 DIAGNOSIS — M10232 Drug-induced gout, left wrist: Secondary | ICD-10-CM | POA: Insufficient documentation

## 2015-05-11 DIAGNOSIS — E119 Type 2 diabetes mellitus without complications: Secondary | ICD-10-CM | POA: Insufficient documentation

## 2015-05-11 DIAGNOSIS — M25532 Pain in left wrist: Secondary | ICD-10-CM | POA: Diagnosis present

## 2015-05-11 DIAGNOSIS — I10 Essential (primary) hypertension: Secondary | ICD-10-CM | POA: Diagnosis not present

## 2015-05-11 DIAGNOSIS — Z880 Allergy status to penicillin: Secondary | ICD-10-CM | POA: Diagnosis not present

## 2015-05-11 DIAGNOSIS — T504X5A Adverse effect of drugs affecting uric acid metabolism, initial encounter: Secondary | ICD-10-CM | POA: Diagnosis not present

## 2015-05-11 HISTORY — DX: Kidney transplant rejection: T86.11

## 2015-05-11 HISTORY — DX: Essential (primary) hypertension: I10

## 2015-05-11 HISTORY — DX: Kidney transplant status: Z94.0

## 2015-05-11 HISTORY — DX: Chronic kidney disease, unspecified: N18.9

## 2015-05-11 HISTORY — DX: Bacteremia: R78.81

## 2015-05-11 HISTORY — DX: Personal history of other infectious and parasitic diseases: Z86.19

## 2015-05-11 HISTORY — DX: Anemia in chronic kidney disease: D63.1

## 2015-05-11 HISTORY — DX: Respiratory failure, unspecified, unspecified whether with hypoxia or hypercapnia: J96.90

## 2015-05-11 HISTORY — DX: Gout, unspecified: M10.9

## 2015-05-11 HISTORY — DX: Type 2 diabetes mellitus without complications: E11.9

## 2015-05-11 HISTORY — DX: Effusion, unspecified hand: M25.449

## 2015-05-11 HISTORY — DX: Alport syndrome: Q87.81

## 2015-05-11 MED ORDER — HYDROCODONE-ACETAMINOPHEN 5-325 MG PO TABS: 1.0000 | ORAL_TABLET | ORAL | Status: DC | PRN

## 2015-05-11 MED ORDER — PREDNISONE 10 MG PO TABS: 30.0000 mg | ORAL_TABLET | Freq: Every day | ORAL | Status: AC

## 2015-05-11 NOTE — Discharge Instructions (Signed)

## 2015-05-11 NOTE — ED Provider Notes (Signed)
[facility] Emergency Department Provider Note ____________________________________________  Time seen: Approximately 10:19 AM  I have reviewed the triage vital signs and the nursing notes.   HISTORY  Chief Complaint Wrist Pain   HPI Francisco Ruiz is a 39 y.o. male who presents to the ER for evaluation of left wrist pain. History of gout. Currently taking allopurinol daily. Pain and swelling started yesterday and worsened through the night. Unable to take NSAIDs due to history of kidney transplant. Recent labs within normal limits. Compliant with follow up appointments.    Past Medical History  Diagnosis Date  . Hypertension   . Diabetes mellitus without complication (HCC)   . 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 H/O Clostridium difficile infection   . Bacteremia   . Alport syndrome   . Gout   . Anemia of chronic kidney failure   . Respiratory failure (HCC)   . Chronic rejection of kidney transplant   . Effusion of hand joint     Patient Active Problem List   Diagnosis Date Noted  . Scapular dysfunction 02/07/2015  . Aftercare following organ transplant 01/03/2014  . H/O kidney transplant 01/03/2014  . Essential (primary) hypertension 08/16/2013  . Bacteremia associated with intravascular line (HCC) 02/23/2013  . C. difficile colitis 02/23/2013  . Bacteremia 02/23/2013  . Blood infection (HCC) 02/21/2013  . 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 N&V (nausea and vomiting) 02/05/2013  . Synovitis or tenosynovitis of wrist 09/27/2012  . Arthritis 09/19/2012  . Gout 09/19/2012  . Effusion of hand joint 09/19/2012  . Pneumonia due to methicillin resistant Staphylococcus aureus (HCC) 09/06/2012  . Acute respiratory failure with hypercapnia (HCC) 08/24/2012  . Arterial blood pressure decreased 08/24/2012  . Failure respiratory (HCC) 08/23/2012  . Absolute anemia 08/15/2012  . Anemia, iron  deficiency 05/13/2012  . Acute kidney failure (HCC) 05/12/2012  . Acute respiratory failure (HCC) 05/12/2012  . BP (high blood pressure) 01/26/2012  . DM (diabetes mellitus), secondary (HCC) 01/26/2012  . Chronic rejection of kidney transplant 01/04/2011    Past Surgical History  Procedure Laterality Date  . Kidney transplant      Current Outpatient Rx  Name  Route  Sig  Dispense  Refill  . allopurinol (ZYLOPRIM) 100 MG tablet   Oral   Take 100 mg by mouth.         Marland Kitchen. aspirin EC 81 MG tablet   Oral   Take 81 mg by mouth.         Marland Kitchen. HYDROcodone-acetaminophen (NORCO/VICODIN) 5-325 MG tablet   Oral   Take 1 tablet by mouth every 4 (four) hours as needed.   12 tablet   0   . hydrOXYzine (ATARAX/VISTARIL) 25 MG tablet      Take daily as needed at bedtime   30 tablet   1   . metFORMIN (GLUCOPHAGE) 1000 MG tablet   Oral   Take 1,000 mg by mouth.         . Multiple Vitamin (MULTIVITAMIN) capsule   Oral   Take by mouth.         . mycophenolate (CELLCEPT) 250 MG capsule      Take 2 tablets (500 mg) every 12 hours. Dx code: z94.0         . predniSONE (DELTASONE) 10 MG tablet   Oral   Take 3 tablets (30 mg total) by mouth daily.   15 tablet  0   . ranitidine (ZANTAC) 150 MG capsule   Oral   Take 150 mg by mouth.         . tacrolimus (PROGRAF) 1 MG capsule      Take 5 (1 mg) capsules every 12 hours. DAW1. Brand name medically necessary. Dx code: Z24.0. TX date: 5.12.2015         . traZODone (DESYREL) 50 MG tablet   Oral   Take 50 mg by mouth.           Allergies Penicillins; Mycophenolate; and Oxycodone  Family History  Problem Relation Age of Onset  . Alport syndrome Mother   . Kidney failure Mother   . Diabetes Father   . Hypertension Father     Social History Social History  Substance Use Topics  . Smoking status: Never Smoker   . Smokeless tobacco: None  . Alcohol Use: No    Review of Systems Constitutional: No recent  illness. Eyes: No visual changes. ENT: No sore throat. Cardiovascular: Denies chest pain or palpitations. Respiratory: Denies shortness of breath. Gastrointestinal: No abdominal pain.  Genitourinary: Negative for dysuria. Musculoskeletal: Pain in left wrist. Skin: Negative for rash. Neurological: Negative for headaches, focal weakness or numbness. 10-point ROS otherwise negative.  ____________________________________________   PHYSICAL EXAM:  VITAL SIGNS: ED Triage Vitals  Enc Vitals Group     BP 05/11/15 1006 139/73 mmHg     Pulse Rate 05/11/15 1006 90     Resp --      Temp 05/11/15 1006 97.9 F (36.6 C)     Temp Source 05/11/15 1006 Oral     SpO2 05/11/15 1006 99 %     Weight --      Height --      Head Cir --      Peak Flow --      Pain Score 05/11/15 1006 7     Pain Loc --      Pain Edu? --      Excl. in GC? --     Constitutional: Alert and oriented. Well appearing and in no acute distress. Eyes: Conjunctivae are normal. EOMI. Head: Atraumatic. Nose: No congestion/rhinnorhea. Neck: No stridor.  Respiratory: Normal respiratory effort.   Musculoskeletal: Increased warmth, mild erythema and edema to the left wrist. ROM limited due to pain.  Neurologic:  Normal speech and language. No gross focal neurologic deficits are appreciated. Speech is normal. No gait instability. Cardiovascular: Radial pulses 2+. Capillary refill WNL. Skin:  Skin is warm, dry and intact. Atraumatic. Psychiatric: Mood and affect are normal. Speech and behavior are normal.  ____________________________________________   LABS (all labs ordered are listed, but only abnormal results are displayed)  Labs Reviewed - No data to display ____________________________________________  RADIOLOGY   ____________________________________________   PROCEDURES  Procedure(s) performed:    ____________________________________________   INITIAL IMPRESSION / ASSESSMENT AND PLAN / ED  COURSE  Pertinent labs & imaging results that were available during my care of the patient were reviewed by me and considered in my medical decision making (see chart for details).  5 day  Prednisone Rx plus hydrocodone /325mg  given. Patient was advised to follow up with PCP if not improving over the next 5 days or return to the ER for symptoms that change or worsen. ____________________________________________   FINAL CLINICAL IMPRESSION(S) / ED DIAGNOSES  Final diagnoses:  Gout of left wrist due to drug, unspecified chronicity       Chinita Pester, FNP 05/11/15 1113  Jennye Moccasin, MD 05/11/15 [phone]

## 2015-05-11 NOTE — ED Notes (Signed)
Left wrist pain x1 day , hx of gout, denies any injury

## 2015-07-31 ENCOUNTER — Other Ambulatory Visit: Payer: Self-pay

## 2015-07-31 DIAGNOSIS — D899 Disorder involving the immune mechanism, unspecified: Principal | ICD-10-CM

## 2015-07-31 DIAGNOSIS — D849 Immunodeficiency, unspecified: Secondary | ICD-10-CM

## 2015-07-31 DIAGNOSIS — Z940 Kidney transplant status: Secondary | ICD-10-CM

## 2015-07-31 NOTE — Progress Notes (Signed)
Patient came in to have blood drawn.  Blood was drawn from the left arm without any incident.

## 2015-08-02 LAB — COMPREHENSIVE METABOLIC PANEL
ALT: 51 IU/L — AB (ref 0–44)
AST: 32 IU/L (ref 0–40)
Albumin/Globulin Ratio: 1.8 (ref 1.1–2.5)
Albumin: 4.5 g/dL (ref 3.5–5.5)
Alkaline Phosphatase: 94 IU/L (ref 39–117)
BILIRUBIN TOTAL: 0.3 mg/dL (ref 0.0–1.2)
BUN / CREAT RATIO: 22 — AB (ref 8–19)
BUN: 35 mg/dL — AB (ref 6–20)
CALCIUM: 10 mg/dL (ref 8.7–10.2)
CO2: 25 mmol/L (ref 18–29)
CREATININE: 1.58 mg/dL — AB (ref 0.76–1.27)
Chloride: 99 mmol/L (ref 96–106)
GFR, EST AFRICAN AMERICAN: 63 mL/min/{1.73_m2} (ref >59)
GFR, EST NON AFRICAN AMERICAN: 55 mL/min/{1.73_m2} — AB (ref >59)
GLUCOSE: 150 mg/dL — AB (ref 65–99)
Globulin, Total: 2.5 g/dL (ref 1.5–4.5)
Potassium: 4.7 mmol/L (ref 3.5–5.2)
Sodium: 140 mmol/L (ref 134–144)
TOTAL PROTEIN: 7 g/dL (ref 6.0–8.5)

## 2015-08-02 LAB — CBC WITH DIFFERENTIAL/PLATELET
BASOS ABS: 0 10*3/uL (ref 0.0–0.2)
BASOS: 1 %
EOS (ABSOLUTE): 0.1 10*3/uL (ref 0.0–0.4)
EOS: 2 %
HEMATOCRIT: 42.6 % (ref 37.5–51.0)
HEMOGLOBIN: 14.4 g/dL (ref 12.6–17.7)
IMMATURE GRANS (ABS): 0 10*3/uL (ref 0.0–0.1)
Immature Granulocytes: 0 %
LYMPHS ABS: 0.8 10*3/uL (ref 0.7–3.1)
LYMPHS: 25 %
MCH: 28.5 pg (ref 26.6–33.0)
MCHC: 33.8 g/dL (ref 31.5–35.7)
MCV: 84 fL (ref 79–97)
MONOCYTES: 7 %
Monocytes Absolute: 0.2 10*3/uL (ref 0.1–0.9)
NEUTROS ABS: 2.3 10*3/uL (ref 1.4–7.0)
Neutrophils: 65 %
Platelets: 169 10*3/uL (ref 150–379)
RBC: 5.05 x10E6/uL (ref 4.14–5.80)
RDW: 15 % (ref 12.3–15.4)
WBC: 3.4 10*3/uL (ref 3.4–10.8)

## 2015-08-02 LAB — TESTOSTERONE: TESTOSTERONE: 510 ng/dL (ref 348–1197)

## 2015-08-02 LAB — TACROLIMUS LEVEL: TACROLIMUS LVL: 3.7 ng/mL (ref 2.0–20.0)

## 2015-08-02 LAB — PHOSPHORUS: Phosphorus: 3.8 mg/dL (ref 2.5–4.5)

## 2015-08-02 LAB — MAGNESIUM: Magnesium: 2.2 mg/dL (ref 1.6–2.3)

## 2015-08-02 LAB — GAMMA GT: GGT: 69 IU/L — ABNORMAL HIGH (ref 0–65)

## 2015-08-05 NOTE — Progress Notes (Signed)
Lab results were faxed to Cecil Cranker, RN at [facility] per patient's request.

## 2015-11-26 ENCOUNTER — Encounter: Payer: Self-pay | Admitting: Physician Assistant

## 2015-11-26 ENCOUNTER — Ambulatory Visit: Payer: Self-pay | Admitting: Physician Assistant

## 2015-11-26 VITALS — BP 110/70 | HR 73 | Temp 97.9°F

## 2015-11-26 DIAGNOSIS — M25512 Pain in left shoulder: Secondary | ICD-10-CM

## 2015-11-26 MED ORDER — METHYLPREDNISOLONE 4 MG PO TBPK: ORAL_TABLET | ORAL | Status: DC

## 2015-11-26 MED ORDER — CYCLOBENZAPRINE HCL 10 MG PO TABS: 10.0000 mg | ORAL_TABLET | Freq: Three times a day (TID) | ORAL | Status: DC | PRN

## 2015-11-26 NOTE — Progress Notes (Signed)
S: c/o left shoulder pain, been going for almost a year, was seen by sports med x 2 , given exercises, has helped some but now he's waking up at least 5 times a night when he rolls onto the shoulder, denies numbness or tingling, pain is located from acj to anterior of bicep  O: vitals wnl, nad, cspine nontender, left shoulder neg for bony tenderness, full rom, n/v intact  A: acute /chronic left shoulder pain  P: flexeril, medrol dose pack, f/u with ortho, call nephrologist prior to taking these medications to make sure he is ok with this treatment plan

## 2016-05-24 ENCOUNTER — Encounter: Payer: Self-pay | Admitting: Emergency Medicine

## 2016-05-24 ENCOUNTER — Emergency Department: Payer: Managed Care, Other (non HMO)

## 2016-05-24 ENCOUNTER — Emergency Department
Admission: EM | Admit: 2016-05-24 | Discharge: 2016-05-24 | Disposition: A | Discharge status: Home / Self Care | Payer: Managed Care, Other (non HMO) | Attending: Emergency Medicine | Admitting: Emergency Medicine

## 2016-05-24 DIAGNOSIS — N189 Chronic kidney disease, unspecified: Secondary | ICD-10-CM | POA: Diagnosis not present

## 2016-05-24 DIAGNOSIS — Z79899 Other long term (current) drug therapy: Secondary | ICD-10-CM | POA: Diagnosis not present

## 2016-05-24 DIAGNOSIS — R6883 Chills (without fever): Secondary | ICD-10-CM | POA: Insufficient documentation

## 2016-05-24 DIAGNOSIS — Z940 Kidney transplant status: Secondary | ICD-10-CM | POA: Insufficient documentation

## 2016-05-24 DIAGNOSIS — Z7982 Long term (current) use of aspirin: Secondary | ICD-10-CM | POA: Insufficient documentation

## 2016-05-24 DIAGNOSIS — I129 Hypertensive chronic kidney disease with stage 1 through stage 4 chronic kidney disease, or unspecified chronic kidney disease: Secondary | ICD-10-CM | POA: Insufficient documentation

## 2016-05-24 DIAGNOSIS — E1122 Type 2 diabetes mellitus with diabetic chronic kidney disease: Secondary | ICD-10-CM | POA: Diagnosis not present

## 2016-05-24 DIAGNOSIS — R112 Nausea with vomiting, unspecified: Secondary | ICD-10-CM | POA: Insufficient documentation

## 2016-05-24 LAB — URINALYSIS COMPLETE WITH MICROSCOPIC (ARMC ONLY)
BACTERIA UA: NONE SEEN
Bilirubin Urine: NEGATIVE
GLUCOSE, UA: 150 mg/dL — AB
Hgb urine dipstick: NEGATIVE
Ketones, ur: NEGATIVE mg/dL
Leukocytes, UA: NEGATIVE
NITRITE: NEGATIVE
PROTEIN: NEGATIVE mg/dL
SPECIFIC GRAVITY, URINE: 1.018 (ref 1.005–1.030)
Squamous Epithelial / LPF: NONE SEEN
pH: 5 (ref 5.0–8.0)

## 2016-05-24 LAB — COMPREHENSIVE METABOLIC PANEL
ALK PHOS: 71 U/L (ref 38–126)
ALT: 28 U/L (ref 17–63)
ANION GAP: 5 (ref 5–15)
AST: 25 U/L (ref 15–41)
Albumin: 4 g/dL (ref 3.5–5.0)
BILIRUBIN TOTAL: 0.7 mg/dL (ref 0.3–1.2)
BUN: 29 mg/dL — ABNORMAL HIGH (ref 6–20)
CALCIUM: 9.1 mg/dL (ref 8.9–10.3)
CO2: 29 mmol/L (ref 22–32)
CREATININE: 1.6 mg/dL — AB (ref 0.61–1.24)
Chloride: 105 mmol/L (ref 101–111)
GFR calc non Af Amer: 53 mL/min — ABNORMAL LOW (ref >60)
GLUCOSE: 242 mg/dL — AB (ref 65–99)
Potassium: 4.3 mmol/L (ref 3.5–5.1)
Sodium: 139 mmol/L (ref 135–145)
TOTAL PROTEIN: 7.1 g/dL (ref 6.5–8.1)

## 2016-05-24 LAB — CBC WITH DIFFERENTIAL/PLATELET
Basophils Absolute: 0 10*3/uL (ref 0–0.1)
Basophils Relative: 0 %
Eosinophils Absolute: 0 10*3/uL (ref 0–0.7)
Eosinophils Relative: 1 %
HEMATOCRIT: 44.9 % (ref 40.0–52.0)
HEMOGLOBIN: 15 g/dL (ref 13.0–18.0)
LYMPHS ABS: 0.2 10*3/uL — AB (ref 1.0–3.6)
LYMPHS PCT: 2 %
MCH: 29.2 pg (ref 26.0–34.0)
MCHC: 33.6 g/dL (ref 32.0–36.0)
MCV: 87.1 fL (ref 80.0–100.0)
MONOS PCT: 4 %
Monocytes Absolute: 0.3 10*3/uL (ref 0.2–1.0)
NEUTROS ABS: 7.6 10*3/uL — AB (ref 1.4–6.5)
NEUTROS PCT: 93 %
Platelets: 118 10*3/uL — ABNORMAL LOW (ref 150–440)
RBC: 5.15 MIL/uL (ref 4.40–5.90)
RDW: 14.9 % — ABNORMAL HIGH (ref 11.5–14.5)
WBC: 8.1 10*3/uL (ref 3.8–10.6)

## 2016-05-24 LAB — TROPONIN I: Troponin I: <0.03 ng/mL (ref <0.03)

## 2016-05-24 MED ORDER — HYDROMORPHONE HCL 1 MG/ML IJ SOLN
1.0000 mg | Freq: Once | INTRAMUSCULAR | Status: AC
  Administered 2016-05-24: 1 mg via INTRAVENOUS
  Filled 2016-05-24: qty 1

## 2016-05-24 MED ORDER — HYDROMORPHONE HCL 1 MG/ML IJ SOLN
INTRAMUSCULAR | Status: AC
  Filled 2016-05-24: qty 1

## 2016-05-24 MED ORDER — HYDROMORPHONE HCL 1 MG/ML IJ SOLN
1.0000 mg | INTRAMUSCULAR | Status: AC
  Administered 2016-05-24: 1 mg via INTRAVENOUS
  Filled 2016-05-24: qty 1

## 2016-05-24 MED ORDER — HYDROCODONE-ACETAMINOPHEN 5-325 MG PO TABS: 1.0000 | ORAL_TABLET | Freq: Four times a day (QID) | ORAL | 0 refills | Status: DC | PRN

## 2016-05-24 MED ORDER — SODIUM CHLORIDE 0.9 % IV BOLUS (SEPSIS)
1000.0000 mL | Freq: Once | INTRAVENOUS | Status: AC
  Administered 2016-05-24: 1000 mL via INTRAVENOUS

## 2016-05-24 MED ORDER — HYDROMORPHONE HCL 1 MG/ML IJ SOLN
INTRAMUSCULAR | Status: AC
Start: 2016-05-24 — End: 2016-05-24
  Administered 2016-05-24: 1 mg via INTRAVENOUS
  Filled 2016-05-24: qty 1

## 2016-05-24 MED ORDER — METOCLOPRAMIDE HCL 5 MG/ML IJ SOLN
10.0000 mg | Freq: Once | INTRAMUSCULAR | Status: AC
  Administered 2016-05-24: 10 mg via INTRAVENOUS
  Filled 2016-05-24: qty 2

## 2016-05-24 MED ORDER — HYDROMORPHONE HCL 1 MG/ML IJ SOLN
1.0000 mg | Freq: Once | INTRAMUSCULAR | Status: AC
  Administered 2016-05-24: 1 mg via INTRAVENOUS

## 2016-05-24 MED ORDER — IOPAMIDOL (ISOVUE-300) INJECTION 61%
15.0000 mL | INTRAVENOUS | Status: AC
  Administered 2016-05-24 (×2): 15 mL via ORAL

## 2016-05-24 MED ORDER — ONDANSETRON 4 MG PO TBDP: 4.0000 mg | ORAL_TABLET | Freq: Four times a day (QID) | ORAL | 0 refills | Status: DC | PRN

## 2016-05-24 NOTE — ED Notes (Signed)
RN and MD return to bedside to reassess patient status. Patient resting comfortably with NAD noted at this time. MD electing not to wake patient at this time. CT contrast volume completed and radiology department has been notified.

## 2016-05-24 NOTE — ED Notes (Signed)
Patient returned to ED 4 from CT at this time.

## 2016-05-24 NOTE — Discharge Instructions (Signed)
You were seen in the emergency room for abdominal pain. It is important that you follow up closely with your transplant team (call your coordinator today) and primary care doctor in the next couple of days.  If you're unable to see your primary care doctor you may return to the emergency room or go to the [facility] walk-in clinic in 1 or 2 days for reexam.  Please return to the emergency room right away if you are to develop a fever, severe nausea, your pain becomes severe or worsens, you are unable to keep food down, begin vomiting any dark or bloody fluid, you develop any dark or bloody stools, feel dehydrated, or other new concerns or symptoms arise.

## 2016-05-24 NOTE — ED Notes (Signed)
Pt c/o of nausea while drinking po ct contrast; spoke with MD and obtained order for Raglan 10mg ; pt states nausea at 3/10

## 2016-05-24 NOTE — ED Notes (Signed)
Patient somnolent following Dilaudid administration. SPO2 WNL when awake, however patient desats into the 80s in the absence of verbal stimulation. Patient placed on 2L of supplemental oxygen via [place]. Saturations improve to 97%. Will continue to monitor. Patient pending CT; radiology to bring oral contrast to bedside.

## 2016-05-24 NOTE — ED Notes (Signed)
RN to bedside for patient reassessment. Patient reporting that pain improved initially, however is now starting to worsen again. Self rates pain 5/10 at present; asking for further intervention. MD made aware. VORB from Fanny BienQuale, MD for Dilaudid 1mg  IVP. Order to be entered and carried by this RN.

## 2016-05-24 NOTE — ED Triage Notes (Signed)
Pt brought in via EMS; EMS states pt's vitals as: bp 122/78, pulse in the 100s, O2 sats 97% on RA, temp 97.9 Oral; EMS states pt c/o fever, chills, stomach pain, headache, nausea, vomiting and diarrhea; EMS gave 4 mg Zofran, and about NS bolus

## 2016-05-24 NOTE — ED Notes (Signed)
MD out of room. VORB for Dialudid 1mg , NS 1000cc bolus, and CT abd/pelvis with ORAL contrast only. [facility] and [facility] by emergency department nursing staff.

## 2016-05-24 NOTE — ED Notes (Signed)
Dr. Fanny BienQuale returns to bedside for patient reassessment and update on plan of care.

## 2016-05-24 NOTE — ED Notes (Signed)
Pt was resting as this RN walked in the room; woke the pt up to assess pain; patient easily roused; pain at 4/10

## 2016-05-24 NOTE — ED Notes (Signed)
Advised pt that urine sample needed; pt states unable to urinate right now

## 2016-05-24 NOTE — ED Notes (Signed)
Patient to CT at this time for MD ordered CT of his abd/pelvis.

## 2016-05-24 NOTE — ED Provider Notes (Addendum)
[facility] Emergency Department Provider Note   ____________________________________________   First MD Initiated Contact with Patient 05/24/16 0122     (approximate)  I have reviewed the triage vital signs and the nursing notes.   HISTORY  Chief Complaint Emesis    HPI Francisco Ruiz is a 40 y.o. male here for evaluation of chills and nausea with abdominal discomfort. Metabolic 11 PM he be experiencing chills, abdominal discomfort nausea vomiting and loose stool. No black or bloody stool. No fever, reports after straining to vomit and feeling nausea began experiencing a bifrontal pounding headache which she reports he gets whenever he is "sick". No numbness tingling or weakness. History of a previous transplanted kidney, currently on antirejection drugs  No fever, no chest pain. No shortness of breath. Reports and nauseated feeling throughout the abdomen   Past Medical History:  Diagnosis Date  . Alport syndrome   . Anemia of chronic kidney failure   . Bacteremia   . Chronic rejection of kidney transplant   . Diabetes mellitus without complication (HCC)   . Effusion of hand joint   . Gout   . H/O Clostridium difficile infection   . 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 Hypertension   . Respiratory failure [facility](HCC)     Patient Active Problem List   Diagnosis Date Noted  . Scapular dysfunction 02/07/2015  . Aftercare following organ transplant 01/03/2014  . H/O kidney transplant 01/03/2014  . Essential (primary) hypertension 08/16/2013  . Bacteremia associated with intravascular line (HCC) 02/23/2013  . C. difficile colitis 02/23/2013  . Bacteremia 02/23/2013  . Blood infection (HCC) 02/21/2013  . 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 N&V (nausea and vomiting) 02/05/2013  . Synovitis or tenosynovitis of wrist 09/27/2012  . Arthritis 09/19/2012  . Gout 09/19/2012  . Effusion of hand joint  09/19/2012  . Pneumonia due to methicillin resistant Staphylococcus aureus (HCC) 09/06/2012  . Acute respiratory failure with hypercapnia (HCC) 08/24/2012  . Arterial blood pressure decreased 08/24/2012  . Failure respiratory (HCC) 08/23/2012  . Absolute anemia 08/15/2012  . Anemia, iron deficiency 05/13/2012  . Acute kidney failure (HCC) 05/12/2012  . Acute respiratory failure (HCC) 05/12/2012  . BP (high blood pressure) 01/26/2012  . DM (diabetes mellitus), secondary (HCC) 01/26/2012  . Chronic rejection of kidney transplant 01/04/2011    Past Surgical History:  Procedure Laterality Date  . KIDNEY TRANSPLANT    . NEPHRECTOMY TRANSPLANTED ORGAN      Prior to Admission medications   Medication Sig Start Date End Date Taking? Authorizing Provider  allopurinol (ZYLOPRIM) 100 MG tablet Take 100 mg by mouth. 04/20/14   Historical Provider, MD  aspirin EC 81 MG tablet Take 81 mg by mouth.    Historical Provider, MD  cyclobenzaprine (FLEXERIL) 10 MG tablet Take 1 tablet (10 mg total) by mouth 3 (three) times daily as needed for muscle spasms. 11/26/15   Faythe GheeSusan W Fisher, PA-C  HYDROcodone-acetaminophen (NORCO/VICODIN) 5-325 MG tablet Take 1 tablet by mouth every 6 (six) hours as needed for moderate pain. 05/24/16   Sharyn CreamerMark Quale, MD  hydrOXYzine (ATARAX/VISTARIL) 25 MG tablet Take daily as needed at bedtime 02/08/15   Judi SaaZachary M Smith, DO  metFORMIN (GLUCOPHAGE) 1000 MG tablet Take 1,000 mg by mouth. 12/18/14 12/18/15  Historical Provider, MD  methylPREDNISolone (MEDROL DOSEPAK) 4 MG TBPK tablet Take 6 pills on day one then decrease by 1 pill each day 11/26/15  Faythe GheeSusan W Fisher, PA-C  Multiple Vitamin (MULTIVITAMIN) capsule Take by mouth.    Historical Provider, MD  mycophenolate (CELLCEPT) 250 MG capsule Take 2 tablets (500 mg) every 12 hours. Dx code: z94.0 12/10/14   Historical Provider, MD  ondansetron (ZOFRAN ODT) 4 MG disintegrating tablet Take 1 tablet (4 mg total) by mouth every 6 (six) hours as  needed for nausea or vomiting. 05/24/16   Sharyn CreamerMark Quale, MD  ranitidine (ZANTAC) 150 MG capsule Take 150 mg by mouth. 07/04/14   Historical Provider, MD  tacrolimus (PROGRAF) 1 MG capsule Take 5 (1 mg) capsules every 12 hours. DAW1. Brand name medically necessary. Dx code: 75Z94.0. [place] date: 5.12.2015 12/18/14   Historical Provider, MD  traZODone (DESYREL) 50 MG tablet Take 50 mg by mouth. 12/15/13   Historical Provider, MD    Allergies Penicillins and Oxycodone  Family History  Problem Relation Age of Onset  . Alport syndrome Mother   . Kidney failure Mother   . Diabetes Father   . Hypertension Father     Social History Social History  Substance Use Topics  . Smoking status: Never Smoker  . Smokeless tobacco: Never Used  . Alcohol use No    Review of Systems Constitutional: No fever, chills and slight fatigue Eyes: No visual changes. ENT: No sore throat. Cardiovascular: Denies chest pain. Respiratory: Denies shortness of breath. Gastrointestinal:  No constipation. Genitourinary: Negative for dysuria. No changes in amount of urination Musculoskeletal: Negative for back pain. Skin: Negative for rash. Neurological: Negative for headaches, focal weakness or numbness.  10-point ROS otherwise negative.  ____________________________________________   PHYSICAL EXAM:  VITAL SIGNS: ED Triage Vitals  Enc Vitals Group     BP 05/24/16 0130 123/77     Pulse Rate 05/24/16 0130 97     Resp 05/24/16 0135 16     Temp 05/24/16 0135 98 F (36.7 C)     Temp Source 05/24/16 0135 Oral     SpO2 05/24/16 0128 98 %     Weight 05/24/16 0135 219 lb (99.3 kg)     Height 05/24/16 0135 5\' 9"  (1.753 m)     Head Circumference --      Peak Flow --      Pain Score 05/24/16 0136 8     Pain Loc --      Pain Edu? --      Excl. in GC? --     Constitutional: Alert and oriented. Well appearing though does appear nauseated reports currently having moderate abdominal discomfort. Eyes: Conjunctivae are  normal. PERRL. EOMI. Head: Atraumatic. Nose: No congestion/rhinnorhea. Mouth/Throat: Mucous membranes are moist.  Oropharynx non-erythematous. Neck: No stridor.  No nuchal rigidity. No meningismus. Able to look back and forth without any neck discomfort or worsening of headache. Cardiovascular: Normal rate, regular rhythm. Grossly normal heart sounds.  Good peripheral circulation. Respiratory: Normal respiratory effort.  No retractions. Lungs CTAB. Gastrointestinal: Soft and mildly tender throughout without focality. No rebound guarding or distention, kidney transplant incisions appear clean dry intact without any erythema. No distention. No abdominal bruits. No CVA tenderness. Musculoskeletal: No lower extremity tenderness nor edema.  No joint effusions. No joint edema, no peripheral edema. Moves all extremities well with normal strength Neurologic:  Normal speech and language. No gross focal neurologic deficits are appreciated. Normal cranial nerve exam. Clear speech.  Skin:  Skin is warm, dry and intact. No rash noted. Psychiatric: Mood and affect are normal. Speech and behavior are normal.  ____________________________________________   LABS (all  labs ordered are listed, but only abnormal results are displayed)  Labs Reviewed  CBC WITH DIFFERENTIAL/PLATELET - Abnormal; Notable for the following:       Result Value   RDW 14.9 (*)    Platelets 118 (*)    Neutro Abs 7.6 (*)    Lymphs Abs 0.2 (*)    All other components within normal limits  COMPREHENSIVE METABOLIC PANEL - Abnormal; Notable for the following:    Glucose, Bld 242 (*)    BUN 29 (*)    Creatinine, Ser 1.60 (*)    GFR calc non Af Amer 53 (*)    All other components within normal limits  URINALYSIS COMPLETEWITH MICROSCOPIC ([facility] ONLY) - Abnormal; Notable for the following:    Color, Urine YELLOW (*)    APPearance CLEAR (*)    Glucose, UA 150 (*)    All other components within normal limits  URINE CULTURE  TROPONIN I    ____________________________________________  EKG  Reviewed and interpreted by me at 3:20 AM Heart rate 90 Care is a 5 QTC 400 Normal sinus rhythm, probable left atrial enlargement. T waves reviewed, no acute or ischemic changes noted ____________________________________________  RADIOLOGY  Ct Abdomen Pelvis Wo Contrast  Result Date: 05/24/2016 CLINICAL DATA:  Nausea and vomiting with fever since last night. History of kidney transplant x3. Diabetes. Respiratory failure. History colitis. EXAM: CT ABDOMEN AND PELVIS WITHOUT CONTRAST TECHNIQUE: Multidetector CT imaging of the abdomen and pelvis was performed following the standard protocol without IV contrast. COMPARISON:  10/22/2005 FINDINGS: Lower chest: Left base scarring. Normal heart size without pericardial or pleural effusion. Markedly age advanced right coronary artery atherosclerosis on image 1/series 2. Suggestion of mild distal esophageal wall thickening on image 8/ series 2. Hepatobiliary: Normal liver. Normal gallbladder, without biliary ductal dilatation. Pancreas: Normal, without mass or ductal dilatation. Spleen: Normal in size, without focal abnormality. Adrenals/Urinary Tract: Normal adrenal glands. Native renal atrophy. Probable right renal vascular calcification. Right iliac fossa renal transplant. Since 2007, this has undergone interval atrophy. The left renal transplant is likely new in the interval. No transplant hydronephrosis. There is left peritransplant interstitial thickening, adjacent the lower pole, including on image 67/series 2. No well-defined fluid collection. Normal urinary bladder. Stomach/Bowel: Contrast filled, mildly distended stomach. No cause identified. Normal colon and terminal ileum. Normal terminal ileum and appendix. Normal small bowel. Vascular/Lymphatic: Normal caliber of the aorta and branch vessels. No abdominopelvic adenopathy. Reproductive: Normal prostate. Other: No significant free fluid. Mild  ventral abdominal wall laxity containing fat Musculoskeletal: Scattered tiny sclerotic lesions are likely bone islands. Left iliac irregularity is likely a bone harvest site. Bilateral L5 pars defects. No malalignment. IMPRESSION: 1. Bilateral iliac fossa renal transplants. Interstitial thickening about the lower pole of the left iliac transplant could be postoperative. Focal transplant pyelonephritis cannot be excluded. No localized fluid collection. 2. No other explanation for fever or abdominal pain. 3. Markedly age advanced coronary artery atherosclerosis. 4. Mild distal esophageal wall thickening suggests esophagitis. Electronically Signed   By: Jeronimo Greaves M.D.   On: 05/24/2016 07:35   Dg Chest Portable 1 View  Result Date: 05/24/2016 CLINICAL DATA:  Fever and chills. EXAM: PORTABLE CHEST 1 VIEW COMPARISON:  May 12, 2012 FINDINGS: The heart size and mediastinal contours are within normal limits. Both lungs are clear. The visualized skeletal structures are unremarkable. IMPRESSION: No active disease. Electronically Signed   By: Gerome Sam III M.D   On: 05/24/2016 01:53    ____________________________________________   PROCEDURES  Procedure(s) performed: None  Procedures  Critical Care performed: No  ____________________________________________   INITIAL IMPRESSION / ASSESSMENT AND PLAN / ED COURSE  Pertinent labs & imaging results that were available during my care of the patient were reviewed by me and considered in my medical decision making (see chart for details).  Presents for evaluation of chills, fatigue, nausea and vomiting. No neurologic, cardiac or pulmonary abnormalities on clinical exam. Reports headaches similar in the past that come about every feels sick or during episodes of nausea, vomiting. Highly doubt any acute intracranial abnormality,  but I am particularly concerned given his transplant status. Labs reassuring, afebrile.  Given nausea vomiting  abdominal discomfort, CT scan performed which does not demonstrate acute abnormality. Urinalysis does not support a diagnosis of UTI or pyelonephritis. Culture sent.  After antiemetics, pain medicine, reports feeling much improved. He is ambulatory and resting comfortably. We discussed careful and very specific return precautions including "any fever" is very agreeable. We'll provide short prescription of pain medicine, antiemetics, and he will be contacting transplant coordinator regarding follow-up and presentation today after today's visit.    Clinical Course as of May 24 810  Wynelle Link May 24, 2016  0431 Resting comfortably. Pain and nausea have improved, still has not urinated and notes a mild bifrontal throbbing headache, reports he gets headaches from time to time when he "gets sick". Completely alert and neurologically intact. Continues to have mild discomfort in the abdomen. We'll proceed with noncontrast CT, additional fluids and continue to monitor closely.  [MQ]  [id number] Reassessment, patient asleep at this time. Resting comfortably. Awaiting CT result  [MQ]    Clinical Course User Index [MQ] Sharyn Creamer, MD     ____________________________________________   FINAL CLINICAL IMPRESSION(S) / ED DIAGNOSES  Final diagnoses:  Non-intractable vomiting with nausea, unspecified vomiting type  Chills      NEW MEDICATIONS STARTED DURING THIS VISIT:  New Prescriptions   HYDROCODONE-ACETAMINOPHEN (NORCO/VICODIN) 5-325 MG TABLET    Take 1 tablet by mouth every 6 (six) hours as needed for moderate pain.   ONDANSETRON (ZOFRAN ODT) 4 MG DISINTEGRATING TABLET    Take 1 tablet (4 mg total) by mouth every 6 (six) hours as needed for nausea or vomiting.     Note:  This document was prepared using Dragon voice recognition software and may include unintentional dictation errors.     Sharyn Creamer, MD 05/24/16 8119    Sharyn Creamer, MD 05/24/16 [phone]

## 2016-05-24 NOTE — ED Notes (Signed)
Advised pt that we need urine sample; pt states unable to urinate right now

## 2016-05-25 LAB — URINE CULTURE
CULTURE: NO GROWTH
Special Requests: NORMAL

## 2016-05-26 ENCOUNTER — Encounter: Payer: Self-pay | Admitting: Emergency Medicine

## 2016-05-26 ENCOUNTER — Emergency Department: Payer: Managed Care, Other (non HMO)

## 2016-05-26 ENCOUNTER — Observation Stay
Admission: EM | Admit: 2016-05-26 | Discharge: 2016-05-27 | Disposition: A | Discharge status: Home / Self Care | Payer: Managed Care, Other (non HMO) | Attending: Internal Medicine | Admitting: Internal Medicine

## 2016-05-26 DIAGNOSIS — D631 Anemia in chronic kidney disease: Secondary | ICD-10-CM | POA: Insufficient documentation

## 2016-05-26 DIAGNOSIS — E1122 Type 2 diabetes mellitus with diabetic chronic kidney disease: Secondary | ICD-10-CM | POA: Insufficient documentation

## 2016-05-26 DIAGNOSIS — Z888 Allergy status to other drugs, medicaments and biological substances status: Secondary | ICD-10-CM | POA: Insufficient documentation

## 2016-05-26 DIAGNOSIS — Z7982 Long term (current) use of aspirin: Secondary | ICD-10-CM | POA: Diagnosis not present

## 2016-05-26 DIAGNOSIS — R110 Nausea: Secondary | ICD-10-CM | POA: Diagnosis not present

## 2016-05-26 DIAGNOSIS — K529 Noninfective gastroenteritis and colitis, unspecified: Principal | ICD-10-CM | POA: Insufficient documentation

## 2016-05-26 DIAGNOSIS — I129 Hypertensive chronic kidney disease with stage 1 through stage 4 chronic kidney disease, or unspecified chronic kidney disease: Secondary | ICD-10-CM | POA: Insufficient documentation

## 2016-05-26 DIAGNOSIS — R197 Diarrhea, unspecified: Secondary | ICD-10-CM

## 2016-05-26 DIAGNOSIS — M109 Gout, unspecified: Secondary | ICD-10-CM | POA: Diagnosis not present

## 2016-05-26 DIAGNOSIS — Z885 Allergy status to narcotic agent status: Secondary | ICD-10-CM | POA: Insufficient documentation

## 2016-05-26 DIAGNOSIS — Z7984 Long term (current) use of oral hypoglycemic drugs: Secondary | ICD-10-CM | POA: Diagnosis not present

## 2016-05-26 DIAGNOSIS — R1084 Generalized abdominal pain: Secondary | ICD-10-CM | POA: Diagnosis present

## 2016-05-26 DIAGNOSIS — Z880 Allergy status to penicillin: Secondary | ICD-10-CM | POA: Insufficient documentation

## 2016-05-26 DIAGNOSIS — Q8781 Alport syndrome: Secondary | ICD-10-CM | POA: Insufficient documentation

## 2016-05-26 DIAGNOSIS — N189 Chronic kidney disease, unspecified: Secondary | ICD-10-CM | POA: Insufficient documentation

## 2016-05-26 DIAGNOSIS — Z940 Kidney transplant status: Secondary | ICD-10-CM | POA: Insufficient documentation

## 2016-05-26 DIAGNOSIS — R109 Unspecified abdominal pain: Secondary | ICD-10-CM

## 2016-05-26 LAB — COMPREHENSIVE METABOLIC PANEL
ALBUMIN: 3.6 g/dL (ref 3.5–5.0)
ALT: 18 U/L (ref 17–63)
AST: 19 U/L (ref 15–41)
Alkaline Phosphatase: 67 U/L (ref 38–126)
Anion gap: 8 (ref 5–15)
BUN: 19 mg/dL (ref 6–20)
CHLORIDE: 104 mmol/L (ref 101–111)
CO2: 25 mmol/L (ref 22–32)
Calcium: 9.2 mg/dL (ref 8.9–10.3)
Creatinine, Ser: 1.59 mg/dL — ABNORMAL HIGH (ref 0.61–1.24)
GFR calc Af Amer: >60 mL/min (ref >60)
GFR, EST NON AFRICAN AMERICAN: 53 mL/min — AB (ref >60)
GLUCOSE: 178 mg/dL — AB (ref 65–99)
POTASSIUM: 3.8 mmol/L (ref 3.5–5.1)
Sodium: 137 mmol/L (ref 135–145)
Total Bilirubin: 0.4 mg/dL (ref 0.3–1.2)
Total Protein: 7 g/dL (ref 6.5–8.1)

## 2016-05-26 LAB — LIPASE, BLOOD: LIPASE: 17 U/L (ref 11–51)

## 2016-05-26 LAB — CBC WITH DIFFERENTIAL/PLATELET
BASOS ABS: 0 10*3/uL (ref 0–0.1)
BASOS PCT: 1 %
EOS ABS: 0.1 10*3/uL (ref 0–0.7)
EOS PCT: 3 %
HCT: 43.6 % (ref 40.0–52.0)
Hemoglobin: 14.6 g/dL (ref 13.0–18.0)
Lymphocytes Relative: 16 %
Lymphs Abs: 0.6 10*3/uL — ABNORMAL LOW (ref 1.0–3.6)
MCH: 29.2 pg (ref 26.0–34.0)
MCHC: 33.5 g/dL (ref 32.0–36.0)
MCV: 87.3 fL (ref 80.0–100.0)
MONO ABS: 0.4 10*3/uL (ref 0.2–1.0)
Monocytes Relative: 11 %
Neutro Abs: 2.7 10*3/uL (ref 1.4–6.5)
Neutrophils Relative %: 69 %
PLATELETS: 113 10*3/uL — AB (ref 150–440)
RBC: 5 MIL/uL (ref 4.40–5.90)
RDW: 14.6 % — AB (ref 11.5–14.5)
WBC: 3.9 10*3/uL (ref 3.8–10.6)

## 2016-05-26 LAB — URINALYSIS COMPLETE WITH MICROSCOPIC (ARMC ONLY)
BILIRUBIN URINE: NEGATIVE
Bacteria, UA: NONE SEEN
Glucose, UA: NEGATIVE mg/dL
KETONES UR: NEGATIVE mg/dL
LEUKOCYTES UA: NEGATIVE
Nitrite: NEGATIVE
PH: 5 (ref 5.0–8.0)
Protein, ur: NEGATIVE mg/dL
SQUAMOUS EPITHELIAL / LPF: NONE SEEN
Specific Gravity, Urine: 1.013 (ref 1.005–1.030)

## 2016-05-26 LAB — GLUCOSE, CAPILLARY: GLUCOSE-CAPILLARY: 133 mg/dL — AB (ref 65–99)

## 2016-05-26 MED ORDER — HYDROMORPHONE HCL 1 MG/ML IJ SOLN
1.0000 mg | Freq: Once | INTRAMUSCULAR | Status: AC
  Administered 2016-05-26: 1 mg via INTRAVENOUS

## 2016-05-26 MED ORDER — HYDROCODONE-ACETAMINOPHEN 5-325 MG PO TABS: 1.0000 | ORAL_TABLET | ORAL | Status: DC | PRN

## 2016-05-26 MED ORDER — OXYCODONE HCL 5 MG PO TABS: 5.0000 mg | ORAL_TABLET | ORAL | Status: DC | PRN

## 2016-05-26 MED ORDER — ONDANSETRON HCL 4 MG/2ML IJ SOLN: 4.0000 mg | Freq: Four times a day (QID) | INTRAMUSCULAR | Status: DC | PRN

## 2016-05-26 MED ORDER — MYCOPHENOLATE MOFETIL 250 MG PO CAPS
500.0000 mg | ORAL_CAPSULE | Freq: Two times a day (BID) | ORAL | Status: DC
  Administered 2016-05-27 (×2): 500 mg via ORAL
  Filled 2016-05-26 (×2): qty 2

## 2016-05-26 MED ORDER — TRAZODONE HCL 50 MG PO TABS: 50.0000 mg | ORAL_TABLET | Freq: Every day | ORAL | Status: DC

## 2016-05-26 MED ORDER — SODIUM CHLORIDE 0.9 % IV BOLUS (SEPSIS)
1000.0000 mL | Freq: Once | INTRAVENOUS | Status: AC
  Administered 2016-05-26: 1000 mL via INTRAVENOUS

## 2016-05-26 MED ORDER — ACETAMINOPHEN 325 MG PO TABS: 650.0000 mg | ORAL_TABLET | Freq: Four times a day (QID) | ORAL | Status: DC | PRN

## 2016-05-26 MED ORDER — HYDROMORPHONE HCL 1 MG/ML IJ SOLN
1.0000 mg | Freq: Once | INTRAMUSCULAR | Status: AC
  Administered 2016-05-26: 1 mg via INTRAVENOUS
  Filled 2016-05-26: qty 1

## 2016-05-26 MED ORDER — ONDANSETRON HCL 4 MG/2ML IJ SOLN: 4.0000 mg | Freq: Once | INTRAMUSCULAR | Status: DC

## 2016-05-26 MED ORDER — ALLOPURINOL 100 MG PO TABS
100.0000 mg | ORAL_TABLET | Freq: Every day | ORAL | Status: DC
  Administered 2016-05-27: 100 mg via ORAL
  Filled 2016-05-26: qty 1

## 2016-05-26 MED ORDER — MYCOPHENOLATE MOFETIL 250 MG PO CAPS
250.0000 mg | ORAL_CAPSULE | Freq: Two times a day (BID) | ORAL | Status: DC
  Filled 2016-05-26: qty 1

## 2016-05-26 MED ORDER — HYDROMORPHONE HCL 1 MG/ML IJ SOLN
1.0000 mg | INTRAMUSCULAR | Status: DC | PRN
  Administered 2016-05-26 – 2016-05-27 (×4): 1 mg via INTRAVENOUS
  Filled 2016-05-26 (×4): qty 1

## 2016-05-26 MED ORDER — INSULIN ASPART 100 UNIT/ML ~~LOC~~ SOLN: 0.0000 [IU] | Freq: Three times a day (TID) | SUBCUTANEOUS | Status: DC

## 2016-05-26 MED ORDER — HYDROMORPHONE HCL 1 MG/ML IJ SOLN
1.0000 mg | INTRAMUSCULAR | Status: DC | PRN
  Filled 2016-05-26: qty 1

## 2016-05-26 MED ORDER — POLYETHYLENE GLYCOL 3350 17 G PO PACK: 17.0000 g | PACK | Freq: Every day | ORAL | Status: DC | PRN

## 2016-05-26 MED ORDER — INSULIN ASPART 100 UNIT/ML ~~LOC~~ SOLN: 0.0000 [IU] | Freq: Every day | SUBCUTANEOUS | Status: DC

## 2016-05-26 MED ORDER — METOCLOPRAMIDE HCL 5 MG/ML IJ SOLN
INTRAMUSCULAR | Status: AC
  Administered 2016-05-26: 10 mg via INTRAVENOUS
  Filled 2016-05-26: qty 2

## 2016-05-26 MED ORDER — METOCLOPRAMIDE HCL 5 MG/ML IJ SOLN
10.0000 mg | Freq: Once | INTRAMUSCULAR | Status: AC
  Administered 2016-05-26: 10 mg via INTRAVENOUS
  Filled 2016-05-26: qty 2

## 2016-05-26 MED ORDER — HYDROMORPHONE HCL 1 MG/ML IJ SOLN
INTRAMUSCULAR | Status: AC
  Administered 2016-05-26: 1 mg via INTRAVENOUS
  Filled 2016-05-26: qty 1

## 2016-05-26 MED ORDER — TACROLIMUS 1 MG PO CAPS
4.0000 mg | ORAL_CAPSULE | Freq: Two times a day (BID) | ORAL | Status: DC
  Administered 2016-05-27: 4 mg via ORAL
  Filled 2016-05-26: qty 4

## 2016-05-26 MED ORDER — SODIUM CHLORIDE 0.9 % IV SOLN
INTRAVENOUS | Status: DC
  Administered 2016-05-26: via INTRAVENOUS

## 2016-05-26 MED ORDER — PREDNISONE 10 MG PO TABS
5.0000 mg | ORAL_TABLET | Freq: Every day | ORAL | Status: DC
  Administered 2016-05-27: 5 mg via ORAL
  Filled 2016-05-26: qty 1

## 2016-05-26 MED ORDER — METOCLOPRAMIDE HCL 5 MG/ML IJ SOLN
10.0000 mg | Freq: Once | INTRAMUSCULAR | Status: AC
  Administered 2016-05-26: 10 mg via INTRAVENOUS

## 2016-05-26 MED ORDER — ACETAMINOPHEN 650 MG RE SUPP: 650.0000 mg | Freq: Four times a day (QID) | RECTAL | Status: DC | PRN

## 2016-05-26 MED ORDER — FAMOTIDINE 20 MG PO TABS: 20.0000 mg | ORAL_TABLET | Freq: Every day | ORAL | Status: DC

## 2016-05-26 MED ORDER — ASPIRIN EC 81 MG PO TBEC
81.0000 mg | DELAYED_RELEASE_TABLET | Freq: Every day | ORAL | Status: DC
  Administered 2016-05-27: 81 mg via ORAL
  Filled 2016-05-26: qty 1

## 2016-05-26 MED ORDER — OXYCODONE HCL ER 10 MG PO T12A
10.0000 mg | EXTENDED_RELEASE_TABLET | Freq: Two times a day (BID) | ORAL | Status: DC
  Administered 2016-05-26 – 2016-05-27 (×2): 10 mg via ORAL
  Filled 2016-05-26 (×2): qty 1

## 2016-05-26 MED ORDER — HYDROMORPHONE HCL 1 MG/ML IJ SOLN
INTRAMUSCULAR | Status: AC
  Filled 2016-05-26: qty 1

## 2016-05-26 MED ORDER — ONDANSETRON HCL 4 MG PO TABS: 4.0000 mg | ORAL_TABLET | Freq: Four times a day (QID) | ORAL | Status: DC | PRN

## 2016-05-26 MED ORDER — ENOXAPARIN SODIUM 40 MG/0.4ML ~~LOC~~ SOLN
40.0000 mg | SUBCUTANEOUS | Status: DC
  Administered 2016-05-27: 40 mg via SUBCUTANEOUS
  Filled 2016-05-26: qty 0.4

## 2016-05-26 NOTE — ED Notes (Signed)
Patient transported to CT 

## 2016-05-26 NOTE — ED Provider Notes (Signed)
40 year old male with a history of renal transplant who is presenting to the emergency department with abdominal pain as well as diarrhea. He was seen several days ago in the emergency department. He was treated supportively at that time and then discharged home with Vicodin which she has been taking, 2 at a time, with only minimal relief. He required 4 different dosings of Dilaudid today and says that this pain keeps coming back after about 45 minutes after he receives the medicine. He has not had any episodes of diarrhea in the emergency department. He does have a history of C. difficile.  Physical Exam  BP 118/82   Pulse 67   Temp 98 F (36.7 C) (Oral)   Resp 17   Ht 5\' 9"  (1.753 m)   Wt 219 lb (99.3 kg)   SpO2 95%   BMI 32.34 kg/m  ----------------------------------------- 3:57 PM on 05/26/2016 -----------------------------------------   Physical Exam  Resting comfortably at this time. Minimal tenderness diffusely to the abdomen. ED Course  Procedures  MDM  CT Renal Soundra PilonStone Study (Accession [id number]) (Order [id number])  Imaging  Date: 05/26/2016 Department: [facility] EMERGENCY DEPARTMENT Released By/Authorizing: Nita Sicklearolina Veronese, MD (auto-released)  Exam Information   Status Exam Begun  Exam Ended   Final [99] 05/26/2016 3:21 PM 05/26/2016 3:25 PM  PACS Images   Show images for CT Renal Stone Study  Study Result   CLINICAL DATA:  Generalized abdominal pain and diarrhea starting this morning  EXAM: CT ABDOMEN AND PELVIS WITHOUT CONTRAST  TECHNIQUE: Multidetector CT imaging of the abdomen and pelvis was performed following the standard protocol without IV contrast.  COMPARISON:  05/24/2016  FINDINGS: Lower chest: No acute abnormality. Small hiatal hernia. Atherosclerotic calcifications of coronary arteries again noted.  Hepatobiliary: Normal unenhanced liver. No calcified gallstones are noted. No biliary ductal dilatation. Small  layering sludge noted within gallbladder.  Pancreas: Normal without peripancreatic stranding.  Spleen: Normal in size.  No focal abnormality.  Adrenals/Urinary Tract: No adrenal gland mass. Native kidneys are markedly atrophic. Again noted right and left pelvic renal transplant. No hydronephrosis or hydroureter. No nephrolithiasis. The urinary bladder is unremarkable.  Stomach/Bowel: No small bowel obstruction. Mild fluid distended small bowel loops in lower abdomen with some air-fluid levels. Findings suspicious for mild ileus or enteritis. Less likely small bowel obstruction. Clinical correlation is necessary. Moderate stool noted in right colon. Normal appendix partially visualized in axial image 50. Moderate stool in transverse colon and descending colon. Moderate stool in proximal sigmoid colon. Some colonic gas noted in distal sigmoid colon. No distal colonic obstruction.  Vascular/Lymphatic: No aortic aneurysm.  No adenopathy.  Reproductive: No pelvic mass. The visualized prostate gland is normal.  Other: No inguinal adenopathy. Stable mild stranding of the fat anterior to left renal transplant probable postsurgical in nature. No abdominal ascites or free abdominal air.  Musculoskeletal: Sagittal images of the spine shows mild degenerative changes lower thoracic spine.  No destructive bony lesions are noted. Again noted L5 bilateral pars defect.  IMPRESSION: 1. Again noted markedly atrophic native kidneys. Bilateral pelvis renal transplants are again noted. No hydronephrosis. 2. Nonspecific mild fluid distended small bowel loops in lower abdomen suspicious for ileus or enteritis. Less likely small bowel obstruction. 3. Colonic stool as described above. No definite evidence of colonic obstruction. No pericecal inflammation. Normal appendix. 4. No ascites or free abdominal air.   Electronically Signed   By: Natasha MeadLiviu  Pop M.D.   On: 05/26/2016 15:38  Patient with nonspecific abdominal scan findings. Bouncing back to the hospital today and requiring multiple rounds of pain medicine. Will be admitted to the hospital. Signed out to Dr. Clint GuyHower. I also reviewed this plan with the patient and family are understanding and willing to comply.      Myrna Blazeravid Matthew Fermin Yan, MD 05/26/16 [phone]

## 2016-05-26 NOTE — ED Notes (Addendum)
Pt wanting to know what pain management plan will be for admission. Pt reports after dilaudid he begins to have pain 45 mins later. Pt is requesting to have pain medication more often or to have pain pump supplied to him during admission. Informed pt this RN will discuss with admitting MD of patient request.

## 2016-05-26 NOTE — ED Triage Notes (Signed)
abd pain and diarrhea this am.

## 2016-05-26 NOTE — ED Notes (Signed)
Waiting on pharm to restock dilaudid in pyxis

## 2016-05-26 NOTE — ED Notes (Signed)
Spoke with admitting MD to inform pt request for pain management plan.

## 2016-05-26 NOTE — H&P (Signed)
[employer] - [place] at [facility]   PATIENT NAME: Francisco Ruiz    MR#:  [id number]  DATE OF BIRTH:  [birth year]   DATE OF ADMISSION:  05/26/2016  PRIMARY CARE PHYSICIAN: Kerrin MoETWILER, RANDAL K, MD   REQUESTING/REFERRING PHYSICIAN: Schaevitz  CHIEF COMPLAINT:   Chief Complaint  Patient presents with  . Abdominal Pain    HISTORY OF PRESENT ILLNESS:  Francisco Ruiz  is a 40 y.o. male with a known history of Alport syndrome status post kidney transplant presenting with abdominal pain. Patient originally a few days ago experience abdominal pain periumbilical in location 7/10 no worsening or relieving factors associated nausea vomiting diarrhea subjective fever and chills most of the above symptoms have improved however still experiencing intermittent abdominal pain with re-presented to the emergency department further workup and evaluation.   Kidney transplant about 20 years ago has been on stable doses of Prograf prednisone and CellCept  PAST MEDICAL HISTORY:   Past Medical History:  Diagnosis Date  . Alport syndrome   . Anemia of chronic kidney failure   . Bacteremia   . Chronic rejection of kidney transplant   . Diabetes mellitus without complication (HCC)   . Effusion of hand joint   . Gout   . H/O Clostridium difficile infection   . 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 Hypertension   . Respiratory failure (HCC)     PAST SURGICAL HISTORY:   Past Surgical History:  Procedure Laterality Date  . KIDNEY TRANSPLANT    . NEPHRECTOMY TRANSPLANTED ORGAN      SOCIAL HISTORY:   Social History  Substance Use Topics  . Smoking status: Never Smoker  . Smokeless tobacco: Never Used  . Alcohol use No    FAMILY HISTORY:   Family History  Problem Relation Age of Onset  . Alport syndrome Mother   . Kidney failure Mother   . Diabetes Father   . Hypertension Father     DRUG ALLERGIES:    Allergies  Allergen Reactions  . Penicillins Rash  . Myfortic [Mycophenolate Mofetil] Itching  . Oxycodone Itching    REVIEW OF SYSTEMS:  REVIEW OF SYSTEMS:  CONSTITUTIONAL: Denies fevers, chills, fatigue, weakness.  EYES: Denies blurred vision, double vision, or eye pain.  EARS, NOSE, THROAT: Denies tinnitus, ear pain, hearing loss.  RESPIRATORY: denies cough, shortness of breath, wheezing  CARDIOVASCULAR: Denies chest pain, palpitations, edema.  GASTROINTESTINAL:Positive nausea, vomiting, diarrhea, abdominal pain.  GENITOURINARY: Denies dysuria, hematuria.  ENDOCRINE: Denies nocturia or thyroid problems. HEMATOLOGIC AND LYMPHATIC: Denies easy bruising or bleeding.  SKIN: Denies rash or lesions.  MUSCULOSKELETAL: Denies pain in neck, back, shoulder, knees, hips, or further arthritic symptoms.  NEUROLOGIC: Denies paralysis, paresthesias.  PSYCHIATRIC: Denies anxiety or depressive symptoms. Otherwise full review of systems performed by me is negative.   MEDICATIONS AT HOME:   Prior to Admission medications   Medication Sig Start Date End Date Taking? Authorizing Provider  allopurinol (ZYLOPRIM) 100 MG tablet Take 100 mg by mouth. 04/20/14  Yes Historical Provider, MD  aspirin EC 81 MG tablet Take 81 mg by mouth.   Yes Historical Provider, MD  glipiZIDE (GLUCOTROL) 5 MG tablet Take 5 mg by mouth daily before breakfast.   Yes Historical Provider, MD  mycophenolate (CELLCEPT) 250 MG capsule Take 2 tablets (500 mg) every 12 hours. Dx code: z94.0 12/10/14  Yes Historical Provider, MD  predniSONE (DELTASONE) 5 MG tablet Take  5 mg by mouth 2 (two) times daily with a meal.   Yes Historical Provider, MD  ranitidine (ZANTAC) 150 MG capsule Take 150 mg by mouth. 07/04/14  Yes Historical Provider, MD  tacrolimus (PROGRAF) 1 MG capsule Take 4 (1 mg) capsules every 12 hours. DAW1. Brand name medically necessary. Dx code: 41Z94.0. [place] date: 5.12.2015 12/18/14  Yes Historical Provider, MD  traZODone  (DESYREL) 50 MG tablet Take 50 mg by mouth. 12/15/13  Yes Historical Provider, MD  metFORMIN (GLUCOPHAGE) 1000 MG tablet Take 1,000 mg by mouth. 12/18/14 12/18/15  Historical Provider, MD  Multiple Vitamin (MULTIVITAMIN) capsule Take by mouth.    Historical Provider, MD      VITAL SIGNS:  Blood pressure 115/79, pulse 69, temperature 98 F (36.7 C), temperature source Oral, resp. rate 10, height 5\' 9"  (1.753 m), weight 99.3 kg (219 lb), SpO2 95 %.  PHYSICAL EXAMINATION:  VITAL SIGNS: Vitals:   05/26/16 1430 05/26/16 1500  BP: 118/82 115/79  Pulse: 67 69  Resp: 17 10  Temp:     GENERAL:40 y.o.male currently in no acute distress.  HEAD: Normocephalic, atraumatic.  EYES: Pupils equal, round, reactive to light. Extraocular muscles intact. No scleral icterus.  MOUTH: Moist mucosal membrane. Dentition intact. No abscess noted.  EAR, NOSE, THROAT: Clear without exudates. No external lesions.  NECK: Supple. No thyromegaly. No nodules. No JVD.  PULMONARY: Clear to ascultation, without wheeze rails or rhonci. No use of accessory muscles, Good respiratory effort. good air entry bilaterally CHEST: Nontender to palpation.  CARDIOVASCULAR: S1 and S2. Regular rate and rhythm. No murmurs, rubs, or gallops. No edema. Pedal pulses 2+ bilaterally.  GASTROINTESTINAL: Soft, nontender, nondistended. No masses. Positive bowel sounds. No hepatosplenomegaly.  MUSCULOSKELETAL: No swelling, clubbing, or edema. Range of motion full in all extremities.  NEUROLOGIC: Cranial nerves II through XII are intact. No gross focal neurological deficits. Sensation intact. Reflexes intact.  SKIN: No ulceration, lesions, rashes, or cyanosis. Skin warm and dry. Turgor intact.  PSYCHIATRIC: Mood, affect within normal limits. The patient is awake, alert and oriented x 3. Insight, judgment intact.    LABORATORY PANEL:   CBC  Recent Labs Lab 05/26/16 0841  WBC 3.9  HGB 14.6  HCT 43.6  PLT 113*    ------------------------------------------------------------------------------------------------------------------  Chemistries   Recent Labs Lab 05/26/16 0841  NA 137  K 3.8  CL 104  CO2 25  GLUCOSE 178*  BUN 19  CREATININE 1.59*  CALCIUM 9.2  AST 19  ALT 18  ALKPHOS 67  BILITOT 0.4   ------------------------------------------------------------------------------------------------------------------  Cardiac Enzymes  Recent Labs Lab 05/24/16 0150  TROPONINI <0.03   ------------------------------------------------------------------------------------------------------------------  RADIOLOGY:  [place] [facility]  Addendum Date: 05/26/2016   ADDENDUM REPORT: 05/26/2016 11:35 ADDENDUM: There does not appear to be any significant hydronephrosis involving the left-sided renal transplant. Electronically Signed   By: Lupita RaiderJames  Green Jr, M.D.   On: 05/26/2016 11:35   Result Date: 05/26/2016 CLINICAL DATA:  Abdominal pain, diarrhea. Status post renal transplant. EXAM: RENAL / URINARY TRACT ULTRASOUND COMPLETE COMPARISON:  CT scan of May 24, 2016. FINDINGS: Right Kidney: Native kidney is atrophic consistent with history of end-stage renal disease. Renal transplant in the right lower quadrant is atrophic, measuring 7.6 cm in length with thin cortex. Left Kidney: Native left kidney is atrophic consistent with history of end-stage renal disease. Renal transplant in the left lower quadrant appears to be normal in size measuring 11.3 cm in length. No hydronephrosis is noted. Doppler demonstrates good color flow. Bladder:  Appears normal for degree of bladder distention. Prevoid volume of 150 mL. Postvoid volume of 108 mL. IMPRESSION: Right renal transplant appears to be atrophic and nonfunctioning. Left iliac fossa renal transplant appears to be normal in size with expected vascularity and no hydronephrosis. Electronically Signed: By: Lupita Raider, M.D. On: 05/26/2016 10:20   Ct Renal Stone  Study  Result Date: 05/26/2016 CLINICAL DATA:  Generalized abdominal pain and diarrhea starting this morning EXAM: CT ABDOMEN AND PELVIS WITHOUT CONTRAST TECHNIQUE: Multidetector CT imaging of the abdomen and pelvis was performed following the standard protocol without IV contrast. COMPARISON:  05/24/2016 FINDINGS: Lower chest: No acute abnormality. Small hiatal hernia. Atherosclerotic calcifications of coronary arteries again noted. Hepatobiliary: Normal unenhanced liver. No calcified gallstones are noted. No biliary ductal dilatation. Small layering sludge noted within gallbladder. Pancreas: Normal without peripancreatic stranding. Spleen: Normal in size.  No focal abnormality. Adrenals/Urinary Tract: No adrenal gland mass. Native kidneys are markedly atrophic. Again noted right and left pelvic renal transplant. No hydronephrosis or hydroureter. No nephrolithiasis. The urinary bladder is unremarkable. Stomach/Bowel: No small bowel obstruction. Mild fluid distended small bowel loops in lower abdomen with some air-fluid levels. Findings suspicious for mild ileus or enteritis. Less likely small bowel obstruction. Clinical correlation is necessary. Moderate stool noted in right colon. Normal appendix partially visualized in axial image 50. Moderate stool in transverse colon and descending colon. Moderate stool in proximal sigmoid colon. Some colonic gas noted in distal sigmoid colon. No distal colonic obstruction. Vascular/Lymphatic: No aortic aneurysm.  No adenopathy. Reproductive: No pelvic mass. The visualized prostate gland is normal. Other: No inguinal adenopathy. Stable mild stranding of the fat anterior to left renal transplant probable postsurgical in nature. No abdominal ascites or free abdominal air. Musculoskeletal: Sagittal images of the spine shows mild degenerative changes lower thoracic spine. No destructive bony lesions are noted. Again noted L5 bilateral pars defect. IMPRESSION: 1. Again noted  markedly atrophic native kidneys. Bilateral pelvis renal transplants are again noted. No hydronephrosis. 2. Nonspecific mild fluid distended small bowel loops in lower abdomen suspicious for ileus or enteritis. Less likely small bowel obstruction. 3. Colonic stool as described above. No definite evidence of colonic obstruction. No pericecal inflammation. Normal appendix. 4. No ascites or free abdominal air. Electronically Signed   By: Natasha Mead M.D.   On: 05/26/2016 15:38    EKG:   Orders placed or performed during the hospital encounter of 05/24/16  . ED EKG  . ED EKG  . EKG 12-Lead  . EKG 12-Lead    IMPRESSION AND PLAN:   40 year old Caucasian gentleman history of all ports syndrome status post kidney transplant presenting with abdominal pain  1. Intractable abdominal pain/ Enteritis: Supportive measures , stable renal function, reassuring urinalysis check Prograf levels as increased tacrolimus mainly the side effects of diarrhea and abdominal pain  2. Post transplant: Consult nephrology 3. Type 2 diabetes non-insulin-requiring: Hold oral agents add sliding scale coverage    All the records are reviewed and case discussed with ED provider. Management plans discussed with the patient, family and they are in agreement.  CODE STATUS: Full  TOTAL TIME TAKING CARE OF THIS PATIENT: 33 minutes.    Arietta Eisenstein,  Mardi Mainland.D on 05/26/2016 at 5:04 PM  Between 7am to 6pm - Pager - [phone]  After 6pm: House Pager: - [phone]  [employer]  Hospitalists  Office  [phone]  CC: Primary care physician; Kerrin Mo, MD

## 2016-05-26 NOTE — ED Notes (Signed)
Pt still unable to urinate at this time, MD notified

## 2016-05-26 NOTE — ED Notes (Signed)
Dr. Don PerkingVeronese in room to assess patient at this time.  Patient is calm and is in no obvious distress at this time.

## 2016-05-26 NOTE — ED Provider Notes (Signed)
[facility] Emergency Department Provider Note  ____________________________________________  Time seen: Approximately 9:03 AM  I have reviewed the triage vital signs and the nursing notes.   HISTORY  Chief Complaint Abdominal Pain   HPI Francisco Ruiz is a 40 y.o. 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 He reports that yesterday he felt better however this morning his symptoms got worse. He has been having watery diarrhea with no melena or blood. Patient does have a prior history of C. difficile. No recent antibiotics. Has had 3 episodes of diarrhea this morning. Also complaining of moderate diffuse cramping abdominal pain that is worse when he has a bowel movement. The abdominal pain is nonradiating and intermittent. Patient also endorses nausea but no vomiting, no fever or chills, no body aches, no chest pain or shortness of breath, no dysuria or hematuria.  Past Medical History:  Diagnosis Date  . Alport syndrome   . Anemia of chronic kidney failure   . Bacteremia   . Chronic rejection of kidney transplant   . Diabetes mellitus without complication (HCC)   . Effusion of hand joint   . Gout   . H/O Clostridium difficile infection   . 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 Hypertension   . Respiratory failure [facility])     Patient Active Problem List   Diagnosis Date Noted  . Intractable abdominal pain 05/26/2016  . Scapular dysfunction 02/07/2015  . Aftercare following organ transplant 01/03/2014  . H/O kidney transplant 01/03/2014  . Essential (primary) hypertension 08/16/2013  . 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 Synovitis or tenosynovitis of wrist 09/27/2012  .  Arthritis 09/19/2012  . Gout 09/19/2012  . Effusion of hand joint 09/19/2012  . Absolute anemia 08/15/2012  . Anemia, iron deficiency 05/13/2012  . BP (high blood pressure) 01/26/2012  . DM (diabetes mellitus), secondary (HCC) 01/26/2012  . Chronic rejection of kidney transplant 01/04/2011    Past Surgical History:  Procedure Laterality Date  . KIDNEY TRANSPLANT    . NEPHRECTOMY TRANSPLANTED ORGAN      Prior to Admission medications   Medication Sig Start Date End Date Taking? Authorizing Provider  allopurinol (ZYLOPRIM) 100 MG tablet Take 100 mg by mouth. 04/20/14  Yes Historical Provider, MD  aspirin EC 81 MG tablet Take 81 mg by mouth.   Yes Historical Provider, MD  glipiZIDE (GLUCOTROL) 5 MG tablet Take 5 mg by mouth daily before breakfast.   Yes Historical Provider, MD  mycophenolate (CELLCEPT) 250 MG capsule Take 2 tablets (500 mg) every 12 hours. Dx code: z94.0 12/10/14  Yes Historical Provider, MD  predniSONE (DELTASONE) 5 MG tablet Take 5 mg by mouth 2 (two) times daily with a meal.   Yes Historical Provider, MD  ranitidine (ZANTAC) 150 MG capsule Take 150 mg by mouth. 07/04/14  Yes Historical Provider, MD  tacrolimus (PROGRAF) 1 MG capsule Take 4 (1 mg) capsules every 12 hours. DAW1. Brand name medically necessary. Dx code: Z65.0. [place] date: 5.12.2015 12/18/14  Yes Historical Provider, MD  traZODone (DESYREL) 50 MG tablet Take 50 mg by mouth. 12/15/13  Yes Historical Provider, MD  Multiple Vitamin (MULTIVITAMIN) capsule Take by mouth.    Historical Provider, MD  oxyCODONE-acetaminophen (  ROXICET) 5-325 MG tablet Take 1 tablet by mouth every 8 (eight) hours as needed for severe pain. 05/27/16   Shaune PollackQing Chen, MD    Allergies Penicillins; Myfortic [mycophenolate mofetil]; and Oxycodone  Family History  Problem Relation Age of Onset  . Alport syndrome Mother   . Kidney failure Mother   . Diabetes Father   . Hypertension Father     Social History Social History  Substance Use  Topics  . Smoking status: Never Smoker  . Smokeless tobacco: Never Used  . Alcohol use No    Review of Systems  Constitutional: Negative for fever. Eyes: Negative for visual changes. ENT: Negative for sore throat. Neck: No neck pain  Cardiovascular: Negative for chest pain. Respiratory: Negative for shortness of breath. Gastrointestinal: + diffuse abdominal pain, nausea, and diarrhea. No vomiting  Genitourinary: Negative for dysuria. Musculoskeletal: Negative for back pain. Skin: Negative for rash. Neurological: Negative for headaches, weakness or numbness. Psych: No SI or HI  ____________________________________________   PHYSICAL EXAM:  VITAL SIGNS: ED Triage Vitals [05/26/16 0822]  Enc Vitals Group     BP 113/70     Pulse Rate 88     Resp 18     Temp 98 F (36.7 C)     Temp Source Oral     SpO2 98 %     Weight 219 lb (99.3 kg)     Height 5\' 9"  (1.753 m)     Head Circumference      Peak Flow      Pain Score 8     Pain Loc      Pain Edu?      Excl. in GC?     Constitutional: Alert and oriented. Well appearing and in no apparent distress. HEENT:      Head: Normocephalic and atraumatic.         Eyes: Conjunctivae are normal. Sclera is non-icteric. EOMI. PERRL      Mouth/Throat: Mucous membranes are moist.       Neck: Supple with no signs of meningismus. Cardiovascular: Regular rate and rhythm. No murmurs, gallops, or rubs. 2+ symmetrical distal pulses are present in all extremities. No JVD. Respiratory: Normal respiratory effort. Lungs are clear to auscultation bilaterally. No wheezes, crackles, or rhonchi.  Gastrointestinal: Soft, non tender, and non distended with positive bowel sounds. No rebound or guarding. No ttp over transplant kidney Genitourinary: No CVA tenderness. Musculoskeletal: Nontender with normal range of motion in all extremities. No edema, cyanosis, or erythema of extremities. Neurologic: Normal speech and language. Face is symmetric. Moving  all extremities. No gross focal neurologic deficits are appreciated. Skin: Skin is warm, dry and intact. No rash noted. Psychiatric: Mood and affect are normal. Speech and behavior are normal.  ____________________________________________   LABS (all labs ordered are listed, but only abnormal results are displayed)  Labs Reviewed  CBC WITH DIFFERENTIAL/PLATELET - Abnormal; Notable for the following:       Result Value   RDW 14.6 (*)    Platelets 113 (*)    Lymphs Abs 0.6 (*)    All other components within normal limits  COMPREHENSIVE METABOLIC PANEL - Abnormal; Notable for the following:    Glucose, Bld 178 (*)    Creatinine, Ser 1.59 (*)    GFR calc non Af Amer 53 (*)    All other components within normal limits  URINALYSIS COMPLETEWITH MICROSCOPIC ([facility] ONLY) - Abnormal; Notable for the following:    Color, Urine YELLOW (*)    APPearance  CLEAR (*)    Hgb urine dipstick 2+ (*)    All other components within normal limits  BASIC METABOLIC PANEL - Abnormal; Notable for the following:    Glucose, Bld 138 (*)    Creatinine, Ser 1.49 (*)    Calcium 8.7 (*)    GFR calc non Af Amer 58 (*)    All other components within normal limits  CBC - Abnormal; Notable for the following:    WBC 3.0 (*)    RBC 4.32 (*)    Hemoglobin 12.7 (*)    HCT 37.4 (*)    Platelets 102 (*)    All other components within normal limits  GLUCOSE, CAPILLARY - Abnormal; Notable for the following:    Glucose-Capillary 133 (*)    All other components within normal limits  GLUCOSE, CAPILLARY - Abnormal; Notable for the following:    Glucose-Capillary 103 (*)    All other components within normal limits  GLUCOSE, CAPILLARY - Abnormal; Notable for the following:    Glucose-Capillary 164 (*)    All other components within normal limits  MRSA PCR SCREENING  URINE CULTURE  LIPASE, BLOOD  LACTIC ACID, PLASMA  TACROLIMUS LEVEL  HEMOGLOBIN A1C    ____________________________________________  EKG  none ____________________________________________  RADIOLOGY  Renal US: Right renal transplant appears to be atrophic and nonfunctioning. Left iliac fossa renal transplant appears to be normal in size with expected vascularity and no hydronephrosis.  CT renal: PND ____________________________________________   PROCEDURES  Procedure(s) performed: None Procedures Critical Care performed:  None ____________________________________________   INITIAL IMPRESSION / ASSESSMENT AND PLAN / ED COURSE  40 y.o. 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 Patient with prior history of C. difficile we'll send stool for culture. We'll get a renal ultrasound to rule out for any evidence of rejection. We'll check lab work and urinalysis. Will give dilaudid, reglan, and IVF  Clinical Course as of May 27 1499  Tue May 26, 2016  1339 Patient received 2 L of normal saline and still no urine output. 3rd bolus ordered. Creatinine at baseline. Renal US with no acute findings. Patient reports pain and nausea are markedly improved. No BM while in the ED.  [CV]  1449 Patient's urine with 2+ hgb but otherwise no signs of infection. Patient reports that pain is back. No more vomiting or diarrhea in the ED. CT a/p wo contrast ordered.  [CV]    Clinical Course User Index [CV] Nita Sicklearolina Ezella Kell, MD   3:10 PM  Care transferred to Dr. Pershing ProudSchaevitz  Pertinent labs & imaging results that were available during my care of the patient were reviewed by me and considered in my medical decision making (see chart for details).    ____________________________________________   FINAL CLINICAL IMPRESSION(S) / ED DIAGNOSES  Final diagnoses:  Generalized abdominal pain  Diarrhea, unspecified type  Nausea      NEW MEDICATIONS STARTED DURING  THIS VISIT:  Discharge Medication List as of 05/27/2016 11:55 AM    START taking these medications   Details  oxyCODONE-acetaminophen (ROXICET) 5-325 MG tablet Take 1 tablet by mouth every 8 (eight) hours as needed for severe pain., Starting Wed 05/27/2016, Print         Note:  This document was prepared using Dragon voice recognition software and may include unintentional dictation errors.    Nita Sicklearolina Vern Guerette, MD 05/27/16 1500

## 2016-05-27 LAB — CBC
HCT: 37.4 % — ABNORMAL LOW (ref 40.0–52.0)
Hemoglobin: 12.7 g/dL — ABNORMAL LOW (ref 13.0–18.0)
MCH: 29.4 pg (ref 26.0–34.0)
MCHC: 34.1 g/dL (ref 32.0–36.0)
MCV: 86.4 fL (ref 80.0–100.0)
PLATELETS: 102 10*3/uL — AB (ref 150–440)
RBC: 4.32 MIL/uL — AB (ref 4.40–5.90)
RDW: 14.5 % (ref 11.5–14.5)
WBC: 3 10*3/uL — AB (ref 3.8–10.6)

## 2016-05-27 LAB — GLUCOSE, CAPILLARY
GLUCOSE-CAPILLARY: 103 mg/dL — AB (ref 65–99)
GLUCOSE-CAPILLARY: 164 mg/dL — AB (ref 65–99)

## 2016-05-27 LAB — URINE CULTURE: Culture: NO GROWTH

## 2016-05-27 LAB — BASIC METABOLIC PANEL
Anion gap: 5 (ref 5–15)
BUN: 16 mg/dL (ref 6–20)
CHLORIDE: 106 mmol/L (ref 101–111)
CO2: 27 mmol/L (ref 22–32)
CREATININE: 1.49 mg/dL — AB (ref 0.61–1.24)
Calcium: 8.7 mg/dL — ABNORMAL LOW (ref 8.9–10.3)
GFR, EST NON AFRICAN AMERICAN: 58 mL/min — AB (ref >60)
Glucose, Bld: 138 mg/dL — ABNORMAL HIGH (ref 65–99)
POTASSIUM: 3.8 mmol/L (ref 3.5–5.1)
SODIUM: 138 mmol/L (ref 135–145)

## 2016-05-27 LAB — MRSA PCR SCREENING: MRSA by PCR: NEGATIVE

## 2016-05-27 MED ORDER — OXYCODONE-ACETAMINOPHEN 5-325 MG PO TABS: 1.0000 | ORAL_TABLET | Freq: Three times a day (TID) | ORAL | 0 refills | Status: DC | PRN

## 2016-05-27 NOTE — Discharge Instructions (Signed)
Heart healthy and ADA diet. °

## 2016-05-27 NOTE — Discharge Summary (Signed)
[facility] - [place] at [facility]   PATIENT NAME: Francisco Ruiz    MR#:  [id number]  DATE OF BIRTH:  [birth year]  DATE OF ADMISSION:  05/26/2016   ADMITTING PHYSICIAN: Wyatt Haste, MD  DATE OF DISCHARGE: 05/27/2016  2:36 PM  PRIMARY CARE PHYSICIAN: Kerrin Mo, MD   ADMISSION DIAGNOSIS:  Nausea [R11.0] Generalized abdominal pain [R10.84] Abdominal pain [R10.9] Diarrhea, unspecified type [R19.7] DISCHARGE DIAGNOSIS:  Active Problems:   Intractable abdominal pain  SECONDARY DIAGNOSIS:   Past Medical History:  Diagnosis Date  . Alport syndrome   . Anemia of chronic kidney failure   . Bacteremia   . Chronic rejection of kidney transplant   . Diabetes mellitus without complication (HCC)   . Effusion of hand joint   . Gout   . H/O Clostridium difficile infection   . 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 Hypertension   . Respiratory failure [facility])    HOSPITAL COURSE:   40 year old Caucasian gentleman history of all ports syndrome status post kidney transplant presenting with abdominal pain  1. Intractable abdominal pain/ Enteritis: Improved with the supportive care and pain control.  2. Post transplant: on prograf. 3. Type 2 diabetes non-insulin-requiring: Hold oral agents, on sliding scale coverage  DISCHARGE CONDITIONS:  Stable, discharged to home today. CONSULTS OBTAINED:  Treatment Team:  Mosetta Pigeon, MD DRUG ALLERGIES:   Allergies  Allergen Reactions  . Penicillins Rash  . Myfortic [Mycophenolate Mofetil] Itching  . Oxycodone Itching   DISCHARGE MEDICATIONS:     Medication List    STOP taking these medications   metFORMIN 1000 MG tablet Commonly known as:  GLUCOPHAGE     TAKE these medications   allopurinol 100 MG tablet Commonly known as:  ZYLOPRIM Take 100 mg by mouth.   aspirin EC 81 MG tablet Take 81 mg by mouth.   glipiZIDE 5 MG  tablet Commonly known as:  GLUCOTROL Take 5 mg by mouth daily before breakfast.   multivitamin capsule Take by mouth.   mycophenolate 250 MG capsule Commonly known as:  CELLCEPT Take 2 tablets (500 mg) every 12 hours. Dx code: z94.0   oxyCODONE-acetaminophen 5-325 MG tablet Commonly known as:  ROXICET Take 1 tablet by mouth every 8 (eight) hours as needed for severe pain.   predniSONE 5 MG tablet Commonly known as:  DELTASONE Take 5 mg by mouth 2 (two) times daily with a meal.   PROGRAF 1 MG capsule Generic drug:  tacrolimus Take 4 (1 mg) capsules every 12 hours. DAW1. Brand name medically necessary. Dx code: Z36.0. TX date: 5.12.2015   ranitidine 150 MG capsule Commonly known as:  ZANTAC Take 150 mg by mouth.   traZODone 50 MG tablet Commonly known as:  DESYREL Take 50 mg by mouth.        DISCHARGE INSTRUCTIONS:  See AVS.  If you experience worsening of your admission symptoms, develop shortness of breath, life threatening emergency, suicidal or homicidal thoughts you must seek medical attention immediately by calling 911 or calling your MD immediately  if symptoms less severe.  You Must read complete instructions/literature along with all the possible adverse reactions/side effects for all the Medicines you take and that have been prescribed to you. Take any new Medicines after you have completely understood and accpet all the possible adverse reactions/side effects.   Please note  You were cared for by a hospitalist during your  hospital stay. If you have any questions about your discharge medications or the care you received while you were in the hospital after you are discharged, you can call the unit and asked to speak with the hospitalist on call if the hospitalist that took care of you is not available. Once you are discharged, your primary care physician will handle any further medical issues. Please note that NO REFILLS for any discharge medications will be  authorized once you are discharged, as it is imperative that you return to your primary care physician (or establish a relationship with a primary care physician if you do not have one) for your aftercare needs so that they can reassess your need for medications and monitor your lab values.    On the day of Discharge:  VITAL SIGNS:  Blood pressure 122/77, pulse 61, temperature 97.7 F (36.5 C), temperature source Oral, resp. rate 16, height 5\' 9"  (1.753 m), weight 219 lb (99.3 kg), SpO2 96 %. PHYSICAL EXAMINATION:  GENERAL:  40 y.o.-year-old patient lying in the bed with no acute distress.  EYES: Pupils equal, round, reactive to light and accommodation. No scleral icterus. Extraocular muscles intact.  HEENT: Head atraumatic, normocephalic. Oropharynx and nasopharynx clear.  NECK:  Supple, no jugular venous distention. No thyroid enlargement, no tenderness.  LUNGS: Normal breath sounds bilaterally, no wheezing, rales,rhonchi or crepitation. No use of accessory muscles of respiration.  CARDIOVASCULAR: S1, S2 normal. No murmurs, rubs, or gallops.  ABDOMEN: Soft, non-tender, non-distended. Bowel sounds present. No organomegaly or mass.  EXTREMITIES: No pedal edema, cyanosis, or clubbing.  NEUROLOGIC: Cranial nerves II through XII are intact. Muscle strength 5/5 in all extremities. Sensation intact. Gait not checked.  PSYCHIATRIC: The patient is alert and oriented x 3.  SKIN: No obvious rash, lesion, or ulcer.  DATA REVIEW:   CBC  Recent Labs Lab 05/27/16 0005  WBC 3.0*  HGB 12.7*  HCT 37.4*  PLT 102*    Chemistries   Recent Labs Lab 05/26/16 0841 05/27/16 0005  NA 137 138  K 3.8 3.8  CL 104 106  CO2 25 27  GLUCOSE 178* 138*  BUN 19 16  CREATININE 1.59* 1.49*  CALCIUM 9.2 8.7*  AST 19  --   ALT 18  --   ALKPHOS 67  --   BILITOT 0.4  --      Microbiology Results  Results for orders placed or performed during the hospital encounter of 05/26/16  Urine culture      Status: None   Collection Time: 05/26/16  2:20 PM  Result Value Ref Range Status   Specimen Description URINE, RANDOM  Final   Special Requests NONE  Final   Culture NO GROWTH [facility]   Final   Report Status 05/27/2016 FINAL  Final  MRSA PCR Screening     Status: None   Collection Time: 05/26/16 11:42 PM  Result Value Ref Range Status   MRSA by PCR NEGATIVE NEGATIVE Final    Comment:        The GeneXpert MRSA Assay (FDA approved for NASAL specimens only), is one component of a comprehensive MRSA colonization surveillance program. It is not intended to diagnose MRSA infection nor to guide or monitor treatment for MRSA infections.     RADIOLOGY:  No results found.   Management plans discussed with the patient, family and they are in agreement.  CODE STATUS:     Code Status Orders        Start  Ordered   05/26/16 1648  Full code  Continuous     05/26/16 1648    Code Status History    Date Active Date Inactive Code Status Order ID Comments User Context   This patient has a current code status but no historical code status.      TOTAL TIME TAKING CARE OF THIS PATIENT: 28 minutes.    Shaune Pollackhen, Henrietta Cieslewicz M.D on 05/27/2016 at 4:33 PM  Between 7am to 6pm - Pager - [phone]  After 6pm go to [email or web address] - Social research officer, governmentpassword [email or web address]  [facility] [place] [facility]  Office  [phone]  CC: Primary care physician; Kerrin MoETWILER, RANDAL K, MD   Note: This dictation was prepared with Dragon dictation along with smaller phrase technology. Any transcriptional errors that result from this process are unintentional.

## 2016-05-27 NOTE — Progress Notes (Signed)
MD ordered patient to be discharged home.  Discharge instructions were reviewed with the patient and he voiced understanding.  Patient was instructed to make his own follow-up appointment.  Prescriptions given to the patient.  IV was removed with catheter intact.  All patients questions were answered.  Patient waiting on his ride to get here and then he wants to walk out with them, he refused to go out in a wheelchair.

## 2016-05-28 LAB — TACROLIMUS LEVEL: Tacrolimus (FK506) - LabCorp: 10.5 ng/mL (ref 2.0–20.0)

## 2016-05-28 LAB — HEMOGLOBIN A1C
HEMOGLOBIN A1C: 7.7 % — AB (ref 4.8–5.6)
Mean Plasma Glucose: 174 mg/dL

## 2017-01-27 ENCOUNTER — Emergency Department
Admission: EM | Admit: 2017-01-27 | Discharge: 2017-01-27 | Disposition: A | Discharge status: Home / Self Care | Payer: Managed Care, Other (non HMO) | Attending: Emergency Medicine | Admitting: Emergency Medicine

## 2017-01-27 ENCOUNTER — Emergency Department: Payer: Managed Care, Other (non HMO)

## 2017-01-27 DIAGNOSIS — M25551 Pain in right hip: Secondary | ICD-10-CM | POA: Diagnosis not present

## 2017-01-27 DIAGNOSIS — Z79899 Other long term (current) drug therapy: Secondary | ICD-10-CM | POA: Diagnosis not present

## 2017-01-27 DIAGNOSIS — I129 Hypertensive chronic kidney disease with stage 1 through stage 4 chronic kidney disease, or unspecified chronic kidney disease: Secondary | ICD-10-CM | POA: Insufficient documentation

## 2017-01-27 DIAGNOSIS — N189 Chronic kidney disease, unspecified: Secondary | ICD-10-CM | POA: Insufficient documentation

## 2017-01-27 DIAGNOSIS — Q8781 Alport syndrome: Secondary | ICD-10-CM | POA: Insufficient documentation

## 2017-01-27 DIAGNOSIS — Z7984 Long term (current) use of oral hypoglycemic drugs: Secondary | ICD-10-CM | POA: Diagnosis not present

## 2017-01-27 DIAGNOSIS — Z940 Kidney transplant status: Secondary | ICD-10-CM | POA: Diagnosis not present

## 2017-01-27 DIAGNOSIS — E1122 Type 2 diabetes mellitus with diabetic chronic kidney disease: Secondary | ICD-10-CM | POA: Diagnosis not present

## 2017-01-27 MED ORDER — HYDROCODONE-ACETAMINOPHEN 7.5-325 MG/15ML PO SOLN: 15.0000 mL | Freq: Four times a day (QID) | ORAL | 0 refills | Status: DC | PRN

## 2017-01-27 NOTE — ED Provider Notes (Signed)
[facility] Emergency Department Provider Note   ____________________________________________   First MD Initiated Contact with Patient 01/27/17 [phone]     (approximate)  I have reviewed the triage vital signs and the nursing notes.   HISTORY  Chief Complaint Hip Pain    HPI Francisco Ruiz is a 41 y.o. male patient complain of posterior right hip pain secondary to a twisting incident yesterday. Patient state after playing basketball he turned to the left and felt a "popping" sensation and has had severe pain since the incident. Patient stated pain increases with ambulation or weightbearing. Patient denies radicular component to his pain.Patient status post history kidney transplant . Past Medical History:  Diagnosis Date  . Alport syndrome   . Anemia of chronic kidney failure   . Bacteremia   . Chronic rejection of kidney transplant   . Diabetes mellitus without complication (HCC)   . Effusion of hand joint   . Gout   . H/O Clostridium difficile infection   . 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 Hypertension   . Respiratory failure [facility])     Patient Active Problem List   Diagnosis Date Noted  . Intractable abdominal pain 05/26/2016  . Scapular dysfunction 02/07/2015  . Aftercare following organ transplant 01/03/2014  . H/O kidney transplant 01/03/2014  . Essential (primary) hypertension 08/16/2013  . 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 Synovitis or tenosynovitis of wrist 09/27/2012  . Arthritis 09/19/2012  . Gout 09/19/2012  . Effusion of hand joint 09/19/2012  . Absolute anemia 08/15/2012  . Anemia, iron deficiency 05/13/2012  . BP (high blood pressure) 01/26/2012  . DM (diabetes mellitus), secondary (HCC) 01/26/2012  . Chronic rejection of kidney transplant 01/04/2011    Past Surgical History:  Procedure Laterality Date  . KIDNEY TRANSPLANT    . NEPHRECTOMY TRANSPLANTED ORGAN        Prior to Admission medications   Medication Sig Start Date End Date Taking? Authorizing Provider  allopurinol (ZYLOPRIM) 100 MG tablet Take 100 mg by mouth. 04/20/14   [provider]  aspirin EC 81 MG tablet Take 81 mg by mouth.    [provider]  glipiZIDE (GLUCOTROL) 5 MG tablet Take 5 mg by mouth daily before breakfast.    [provider]  HYDROcodone-acetaminophen (HYCET) 7.5-325 mg/15 ml solution Take 15 mLs by mouth 4 (four) times daily as needed for moderate pain. 01/27/17 01/27/18  Joni Reining, PA-C  Multiple Vitamin (MULTIVITAMIN) capsule Take by mouth.    [provider]  mycophenolate (CELLCEPT) 250 MG capsule Take 2 tablets (500 mg) every 12 hours. Dx code: z94.0 12/10/14   [provider]  oxyCODONE-acetaminophen (ROXICET) 5-325 MG tablet Take 1 tablet by mouth every 8 (eight) hours as needed for severe pain. 05/27/16   Shaune Pollack, MD  predniSONE (DELTASONE) 5 MG tablet Take 5 mg by mouth 2 (two) times daily with a meal.    [provider]  ranitidine (ZANTAC) 150 MG capsule Take 150 mg by mouth. 07/04/14   [provider]  tacrolimus (PROGRAF) 1 MG capsule Take 4 (1 mg) capsules every 12 hours. DAW1. Brand name medically necessary. Dx code: Z77.0. TX date: 5.12.2015 12/18/14   [provider]  traZODone (DESYREL) 50 MG tablet Take 50 mg by mouth. 12/15/13   [provider]    Allergies   Family History  Problem Relation Age of Onset  . Alport  syndrome Mother   . Kidney failure Mother   . Diabetes Father   . Hypertension Father     Social History Social History  Substance Use Topics  . Smoking status: Never Smoker  . Smokeless tobacco: Never Used  . Alcohol use No    Review of Systems  Constitutional: No fever/chills Eyes: No visual changes. ENT: No sore throat. Cardiovascular: Denies chest pain. Respiratory: Denies shortness of breath. Gastrointestinal: No abdominal pain.  No  nausea, no vomiting.  No diarrhea.  No constipation. Genitourinary: Negative for dysuria. Musculoskeletal: Right hip pain Skin: Negative for rash. Neurological: Negative for headaches, focal weakness or numbness. Endocrine:Diabetes, hypertension, and gout. Hematological/Lymphatic:Anemia secondary to chronic kidney failure Allergic/Immunilogical: Oxycodone and ibuprofen  ____________________________________________   PHYSICAL EXAM:  VITAL SIGNS: ED Triage Vitals [01/27/17 0813]  Enc Vitals Group     BP 125/75     Pulse Rate 95     Resp 18     Temp 98 F (36.7 C)     Temp Source Oral     SpO2 98 %     Weight      Height      Head Circumference      Peak Flow      Pain Score      Pain Loc      Pain Edu?      Excl. in GC?     Constitutional: Alert and oriented. Well appearing and in no acute distress. Cardiovascular: Normal rate, regular rhythm. Grossly normal heart sounds.  Good peripheral circulation. Respiratory: Normal respiratory effort.  No retractions. Lungs CTAB. Gastrointestinal: Soft and nontender. No distention. No abdominal bruits. No CVA tenderness. Musculoskeletal:No obvious deformity of the right hip. No leg length discrepancy. Patient is tender palpation right iliac crest. Patient relates atypical gait favoring her right lower extremity.  Neurologic:  Normal speech and language. No gross focal neurologic deficits are appreciated. No gait instability. Skin:  Skin is warm, dry and intact. No rash noted. Psychiatric: Mood and affect are normal. Speech and behavior are normal.  ____________________________________________   LABS (all labs ordered are listed, but only abnormal results are displayed)  Labs Reviewed - No data to display ____________________________________________  EKG   ____________________________________________  RADIOLOGY  Dg Hip Unilat W Or Wo Pelvis 2-3 Views Right  Result Date: 01/27/2017 CLINICAL DATA:  Posterior right hip  pain after twisting injury while playing basketball. EXAM: DG HIP (WITH OR WITHOUT PELVIS) 2-3V RIGHT COMPARISON:  CT abdomen and pelvis dated May 26, 2016. FINDINGS: There is no evidence of hip fracture or dislocation. There is no evidence of arthropathy or other focal bone abnormality. Multiple surgical clips are again noted in the pelvis. IMPRESSION: No acute fracture or malalignment. If occult hip fracture is suspected or if the patient is unable to bear weight, MRI is the preferred modality for further evaluation. Electronically Signed   By: Obie DredgeWilliam T Derry M.D.   On: 01/27/2017 09:05    __No acute findings on x-ray. __________________________________________   PROCEDURES  Procedure(s) performed: None  Procedures  Critical Care performed: No  ____________________________________________   INITIAL IMPRESSION / ASSESSMENT AND PLAN / ED COURSE  Pertinent labs & imaging results that were available during my care of the patient were reviewed by me and considered in my medical decision making (see chart for details).  Right hip pain secondary to muscle skeletal strain. Discussed negative x-ray finding with patient. Patient given discharge care instructions. Patient advised return back in 3 days if  no improvement or worsening of pain.      ____________________________________________   FINAL CLINICAL IMPRESSION(S) / ED DIAGNOSES  Final diagnoses:  Right hip pain      NEW MEDICATIONS STARTED DURING THIS VISIT:  New Prescriptions   HYDROCODONE-ACETAMINOPHEN (HYCET) 7.5-325 MG/15 ML SOLUTION    Take 15 mLs by mouth 4 (four) times daily as needed for moderate pain.     Note:  This document was prepared using Dragon voice recognition software and may include unintentional dictation errors.    Joni ReiningSmith, Oneal Biglow K, PA-C 01/27/17 [id number]    Sharman CheekStafford, Phillip, MD 01/29/17 2018

## 2017-01-27 NOTE — ED Notes (Signed)
See triage note. Right hip pain 6/10. Able to bear weight.

## 2017-01-27 NOTE — ED Triage Notes (Signed)
Pt states he was standing and turned to the left and his right hip popped and since he has had severe pain. Pt is able to ambulate.

## 2017-02-04 ENCOUNTER — Encounter: Payer: Self-pay | Admitting: *Deleted

## 2017-02-04 ENCOUNTER — Emergency Department
Admission: EM | Admit: 2017-02-04 | Discharge: 2017-02-04 | Disposition: A | Discharge status: Home / Self Care | Payer: Managed Care, Other (non HMO) | Attending: Emergency Medicine | Admitting: Emergency Medicine

## 2017-02-04 ENCOUNTER — Emergency Department: Payer: Managed Care, Other (non HMO)

## 2017-02-04 DIAGNOSIS — S79911A Unspecified injury of right hip, initial encounter: Secondary | ICD-10-CM | POA: Diagnosis present

## 2017-02-04 DIAGNOSIS — I10 Essential (primary) hypertension: Secondary | ICD-10-CM | POA: Diagnosis not present

## 2017-02-04 DIAGNOSIS — Y9367 Activity, basketball: Secondary | ICD-10-CM | POA: Diagnosis not present

## 2017-02-04 DIAGNOSIS — Y999 Unspecified external cause status: Secondary | ICD-10-CM | POA: Diagnosis not present

## 2017-02-04 DIAGNOSIS — S76312A Strain of muscle, fascia and tendon of the posterior muscle group at thigh level, left thigh, initial encounter: Secondary | ICD-10-CM | POA: Diagnosis not present

## 2017-02-04 DIAGNOSIS — E119 Type 2 diabetes mellitus without complications: Secondary | ICD-10-CM | POA: Diagnosis not present

## 2017-02-04 DIAGNOSIS — Y929 Unspecified place or not applicable: Secondary | ICD-10-CM | POA: Diagnosis not present

## 2017-02-04 DIAGNOSIS — W19XXXA Unspecified fall, initial encounter: Secondary | ICD-10-CM | POA: Diagnosis not present

## 2017-02-04 DIAGNOSIS — Z79899 Other long term (current) drug therapy: Secondary | ICD-10-CM | POA: Diagnosis not present

## 2017-02-04 DIAGNOSIS — Z7982 Long term (current) use of aspirin: Secondary | ICD-10-CM | POA: Diagnosis not present

## 2017-02-04 DIAGNOSIS — S76011A Strain of muscle, fascia and tendon of right hip, initial encounter: Secondary | ICD-10-CM

## 2017-02-04 MED ORDER — OXYCODONE-ACETAMINOPHEN 5-325 MG PO TABS: 1.0000 | ORAL_TABLET | Freq: Four times a day (QID) | ORAL | 0 refills | Status: DC | PRN

## 2017-02-04 MED ORDER — HYDROMORPHONE HCL 1 MG/ML IJ SOLN
INTRAMUSCULAR | Status: AC
  Administered 2017-02-04: 1 mg via INTRAMUSCULAR
  Filled 2017-02-04: qty 1

## 2017-02-04 MED ORDER — HYDROMORPHONE HCL 1 MG/ML IJ SOLN
1.0000 mg | INTRAMUSCULAR | Status: AC
  Administered 2017-02-04: 1 mg via INTRAMUSCULAR
  Filled 2017-02-04: qty 1

## 2017-02-04 MED ORDER — HYDROMORPHONE HCL 1 MG/ML IJ SOLN
1.0000 mg | INTRAMUSCULAR | Status: AC
  Administered 2017-02-04: 1 mg via INTRAMUSCULAR

## 2017-02-04 NOTE — ED Provider Notes (Signed)
[facility] Emergency Department Provider Note   ____________________________________________   First MD Initiated Contact with Patient 02/04/17 1853     (approximate)  I have reviewed the triage vital signs and the nursing notes.   HISTORY  Chief Complaint Hip Pain    HPI Francisco Ruiz is a 41 y.o. male presents for evaluation of right hip pain  The chief reports that on July 25 he is playing basketball, he twisted and felt a pop in his right hip. Located over the right lateral area of the hip and slightly into the right buttock. He had x-rays, has been sore and having some trouble walking since that seems to be slowly improving. He went to get up from his chair this evening and felt another "pop" feeling in the same area now having return of pain in the same area. He points directly over the right trochanter and reports the pain radiates slightly into his right buttock.  No fevers or chills. No nausea or vomiting. No numbness or tingling. Has not seen any swelling. Denies any weakness or numbness in the right leg. No back pain. No other injuriesand did not fall   Past Medical History:  Diagnosis Date  . Alport syndrome   . Anemia of chronic kidney failure   . Bacteremia   . Chronic rejection of kidney transplant   . Diabetes mellitus without complication (HCC)   . Effusion of hand joint   . Gout   . H/O Clostridium difficile infection   . 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 Hypertension   . Respiratory failure [facility])     Patient Active Problem List   Diagnosis Date Noted  . Intractable abdominal pain 05/26/2016  . Scapular dysfunction 02/07/2015  . Aftercare following organ transplant 01/03/2014  . H/O kidney transplant 01/03/2014  . Essential (primary) hypertension 08/16/2013  . 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 Synovitis or tenosynovitis of wrist 09/27/2012  . Arthritis  09/19/2012  . Gout 09/19/2012  . Effusion of hand joint 09/19/2012  . Absolute anemia 08/15/2012  . Anemia, iron deficiency 05/13/2012  . BP (high blood pressure) 01/26/2012  . DM (diabetes mellitus), secondary (HCC) 01/26/2012  . Chronic rejection of kidney transplant 01/04/2011    Past Surgical History:  Procedure Laterality Date  . KIDNEY TRANSPLANT    . NEPHRECTOMY TRANSPLANTED ORGAN      Prior to Admission medications   Medication Sig Start Date End Date Taking? Authorizing Provider  allopurinol (ZYLOPRIM) 100 MG tablet Take 100 mg by mouth. 04/20/14   [provider]  aspirin EC 81 MG tablet Take 81 mg by mouth.    [provider]  glipiZIDE (GLUCOTROL) 5 MG tablet Take 5 mg by mouth daily before breakfast.    [provider]  Multiple Vitamin (MULTIVITAMIN) capsule Take by mouth.    [provider]  mycophenolate (CELLCEPT) 250 MG capsule Take 2 tablets (500 mg) every 12 hours. Dx code: z94.0 12/10/14   [provider]  oxyCODONE-acetaminophen (ROXICET) 5-325 MG tablet Take 1-2 tablets by mouth every 6 (six) hours as needed for severe pain. 02/04/17   Sharyn Creamer, MD  predniSONE (DELTASONE) 5 MG tablet Take 5 mg by mouth 2 (two) times daily with a meal.    [provider]  ranitidine (ZANTAC) 150 MG capsule Take 150 mg by mouth. 07/04/14   [provider]  tacrolimus (PROGRAF) 1 MG capsule  Take 4 (1 mg) capsules every 12 hours. DAW1. Brand name medically necessary. Dx code: Z78.0. TX date: 5.12.2015 12/18/14   [provider]  traZODone (DESYREL) 50 MG tablet Take 50 mg by mouth. 12/15/13   [provider]    Allergies Penicillins; Myfortic [mycophenolate mofetil]; and Oxycodone  Family History  Problem Relation Age of Onset  . Alport syndrome Mother   . Kidney failure Mother   . Diabetes Father   . Hypertension Father     Social History Social History  Substance Use Topics  . Smoking status:  Never Smoker  . Smokeless tobacco: Never Used  . Alcohol use No    Review of Systems Constitutional: No fever/chills Eyes: No visual changes. ENT: No sore throat. Cardiovascular: Denies chest pain. Respiratory: Denies shortness of breath. Gastrointestinal: No abdominal pain.  No nausea, no vomiting.   Genitourinary: Negative for dysuria. Musculoskeletal: Negative for back pain.See history of present illness Skin: Negative for rash. Neurological: Negative for headaches, focal weakness or numbness.    ____________________________________________   PHYSICAL EXAM:  VITAL SIGNS: ED Triage Vitals  Enc Vitals Group     BP --      Pulse --      Resp --      Temp 02/04/17 1855 98 F (36.7 C)     Temp Source 02/04/17 1855 Oral     SpO2 --      Weight --      Height --      Head Circumference --      Peak Flow --      Pain Score 02/04/17 1854 8     Pain Loc --      Pain Edu? --      Excl. in GC? --     Constitutional: Alert and oriented. Well appearing and in no acute distress.Is able to walk himself in but has a slightly antalgic gait with definite pain and discomfort in the right hip  Eyes: Conjunctivae are normal. Head: Atraumatic. Nose: No congestion/rhinnorhea. Mouth/Throat: Mucous membranes are moist. Neck: No stridor.   Cardiovascular: Normal rate, regular rhythm. Grossly normal heart sounds.  Good peripheral circulation. Respiratory: Normal respiratory effort.  No retractions. Lungs CTAB. Gastrointestinal: Soft and nontender.  Musculoskeletal:   Lower Extremities  No edema. Normal DP/PT pulses bilateral with good cap refill.  Normal neuro-motor function lower extremities bilateral.  RIGHT Right lower extremity demonstrates limited range of motion due to pain reported the right hip, good use of all muscles. No edema bruising or contusions of the right hip, right knee, right ankle. There is focal tenderness over the right greater trochanter along the right  posterior buttock. No overlying skin lesions. No warmth or erythema.  No pain on axial loading. No evidence of trauma. Moves the ankle and knee without any difficulty. Right hip is definitely tender laterally  LEFT Left lower extremity demonstrates normal strength, good use of all muscles. No edema bruising or contusions of the hip,  knee, ankle. Full range of motion of the left lower extremity without pain. No pain on axial loading. No evidence of trauma.   Neurologic:  Normal speech and language. No gross focal neurologic deficits are appreciated.  Skin:  Skin is warm, dry and intact. No rash noted. Psychiatric: Mood and affect are normal. Speech and behavior are normal.  ____________________________________________   LABS (all labs ordered are listed, but only abnormal results are displayed)  Labs Reviewed - No data to display ____________________________________________  EKG  ____________________________________________  RADIOLOGY   Mr Hip Right Wo Contrast  Result Date: 02/04/2017 CLINICAL DATA:  Posterior right hip pain after twisting accident. Basketball injury. Severe pain since the incident. Pain increases with ambulation or weight-bearing. EXAM: MR OF THE RIGHT HIP WITHOUT CONTRAST TECHNIQUE: Multiplanar, multisequence MR imaging was performed. No intravenous contrast was administered. COMPARISON:  Right hip radiographs 01/27/2017 FINDINGS: Bones: No evidence of acute fracture or dislocation. No bone contusion. No focal bone lesions. Articular cartilage and labrum Articular cartilage: Limited visualization. Appears grossly intact. Labrum:  Not well visualized. Joint or bursal effusion Joint effusion:  No significant effusion. Bursae:  No bursal collections. Muscles and tendons Muscles and tendons: Increased T2 signal intensity and edema demonstrated in the right gluteus muscles, most prominently in the gluteus medius. Infiltration and edema extends from the insertion on the  anterior iliac spine down to the greater trochanter. There is associated edema in the surrounding subcutaneous fat. No loculated collections are demonstrated. In the setting of trauma these changes are likely to represent muscular contusion with a partial tear of the muscle and tendon with possible avulsion at the insertion site. Myositis would be less likely in the setting of trauma. Other findings Miscellaneous: Bilateral pelvic renal transplants with delayed nephrogram of the right transplant in comparison to the left suggesting decreased function on the right. Bladder is filled without wall thickening or filling defect. IMPRESSION: 1. No acute fracture or dislocation of the right hip. 2. Increased T2 signal intensity and edema in around the right gluteus muscles, most prominently in the gluteus medius. Changes likely to represent muscular contusion with partial tear of the muscle and tendon and possible avulsion at the iliac origin. Electronically Signed   By: Burman NievesWilliam  Stevens M.D.   On: 02/04/2017 22:26    ____________________________________________   PROCEDURES  Procedure(s) performed: None  Procedures  Critical Care performed: No  ____________________________________________   INITIAL IMPRESSION / ASSESSMENT AND PLAN / ED COURSE  Pertinent labs & imaging results that were available during my care of the patient were reviewed by me and considered in my medical decision making (see chart for details).  No systemic symptoms. Injury pattern seems to be that of likely musculoskeletal injury, but given the patient's history of immunosuppression and previous high-dose prednisone he could have advanced osteopenia. X-rays from last visit were normal. No obvious evidence of fracture, shortening, or dislocation today. Focal tenderness about the greater trochanter and slightly around right buttock region. No fevers or chills. No evidence of a septic hip by history or exam.  We'll proceed with MRI  of the right hip to evaluate for occult fracture.  Clinical Course as of Feb 04 2310  Thu Feb 04, 2017  2201 Resting currently. Reports pain improved. Awaiting MRI result.  [MQ]    Clinical Course User Index [MQ] Sharyn CreamerQuale, Seniyah Esker, MD    ----------------------------------------- 11:11 PM on 02/04/2017 -----------------------------------------  I will prescribe the patient a narcotic pain medicine due to their condition which I anticipate will cause at least moderate pain short term. I discussed with the patient safe use of narcotic pain medicines, and that they are not to drive, work in dangerous areas, or ever take more than prescribed (no more than 1 pill every 6 hours). We discussed that this is the type of medication that can be  overdosed on and the risks of this type of medicine. Patient is very agreeable to only use as prescribed and to never use more than prescribed.  Discussed with Dr.  Poggi. Recommends crutches, follow up in about 1-2 weeks with orthopedics. Pain control, no indication for surgery at this time. Conservative management.  Patient will discard hydrocodone prescription which he was given previous, takes no other narcotics.  Return precautions and treatment recommendations and follow-up discussed with the patient who is agreeable with the plan.  ____________________________________________   FINAL CLINICAL IMPRESSION(S) / ED DIAGNOSES  Final diagnoses:  Strain of gluteus medius, right, initial encounter      NEW MEDICATIONS STARTED DURING THIS VISIT:  New Prescriptions   OXYCODONE-ACETAMINOPHEN (ROXICET) 5-325 MG TABLET    Take 1-2 tablets by mouth every 6 (six) hours as needed for severe pain.     Note:  This document was prepared using Dragon voice recognition software and may include unintentional dictation errors.     Sharyn CreamerQuale, Sayler Mickiewicz, MD 02/04/17 2311

## 2017-02-04 NOTE — Discharge Instructions (Signed)
Please use crutches for the next 1-2 weeks and until he had follow-up with orthopedics. Only apply minimal "toe-touch" weight onto the right leg.  Please disregard your hydrocodone prescription, we will be changing you to Percocet for pain relief. Do not drive while taking this medicine. Never take more than prescribed.

## 2017-02-04 NOTE — ED Notes (Signed)
Family at bedside to provide pt with a ride home. Friends also at bedside with pt. NAD noted at this time.

## 2017-02-04 NOTE — ED Notes (Signed)
Pt updated that MRI had a pt currently and there would be a 1-2 hour delay in scan. Pt verbalized understanding.

## 2017-02-04 NOTE — ED Notes (Signed)
MRI on phone with pt at this time. Pt alert and oriented x 4 and able to talk to MRI on phone without difficulty. Pt denies being claustrophobic unless scan will last "a very long time."

## 2017-02-04 NOTE — ED Notes (Signed)
PT verbalized understanding of crutches and discharge follow up care. Pt taken to car via wheelchair and driven home by family. Pt in NAD at time of discharge

## 2017-12-29 ENCOUNTER — Ambulatory Visit: Payer: Self-pay | Admitting: Family Medicine

## 2017-12-29 VITALS — BP 116/78 | HR 92 | Resp 20 | Ht 70.0 in | Wt 227.0 lb

## 2017-12-29 DIAGNOSIS — Z0189 Encounter for other specified special examinations: Principal | ICD-10-CM

## 2017-12-29 DIAGNOSIS — Z008 Encounter for other general examination: Secondary | ICD-10-CM

## 2017-12-29 NOTE — Progress Notes (Signed)
Subjective: Annual biometrics screening  Patient presents for his annual biometric screening. Patient reports eating a healthy, well-rounded diet and getting regular physical activity.  Patient regularly sees his nephrologist and endocrinologist but does not currently have a primary care provider. PCP: None currently. Patient works for [employer]. Patient denies any other issues or concerns.   Review of Systems Unremarkable  Objective  Physical Exam General: Awake, alert and oriented. No acute distress. Well developed, hydrated and nourished. Appears stated age.  HEENT: Supple neck without adenopathy. Sclera is non-icteric. The ear canal is clear without discharge. The tympanic membrane is normal in appearance with normal landmarks and cone of light. Nasal mucosa is pink and moist. Oral mucosa is pink and moist. The pharynx is normal in appearance without tonsillar swelling or exudates.  Skin: Skin in warm, dry and intact without rashes or lesions. Appropriate color for ethnicity. Cardiac: Heart rate and rhythm are normal. No murmurs, gallops, or rubs are auscultated.  Respiratory: The chest wall is symmetric and without deformity. No signs of respiratory distress. Lung sounds are clear in all lobes bilaterally without rales, ronchi, or wheezes.  Neurological: The patient is awake, alert and oriented to person, place, and time with normal speech.  Memory is normal and thought processes intact. No gait abnormalities are appreciated.  Psychiatric: Appropriate mood and affect.   Assessment Annual biometrics screening  Plan  Lipid panel and nonfasting blood sugar pending.  Patient has a history of type 2 diabetes and reports that his blood sugar has recently been running in the low 200s and that he has informed his endocrinologist of this.  Patient also reports a history of hyperlipidemia in the past.   Encouraged routine visits with primary care provider.  Provided patient with a list of local  resources and encouraged him to establish care with a primary care provider. Encouraged patient to get regular exercise and eat a healthy, well-rounded diet.

## 2017-12-30 LAB — LIPID PANEL
CHOL/HDL RATIO: 3.9 ratio (ref 0.0–5.0)
Cholesterol, Total: 229 mg/dL — ABNORMAL HIGH (ref 100–199)
HDL: 58 mg/dL (ref >39)
LDL Calculated: 143 mg/dL — ABNORMAL HIGH (ref 0–99)
TRIGLYCERIDES: 138 mg/dL (ref 0–149)
VLDL Cholesterol Cal: 28 mg/dL (ref 5–40)

## 2017-12-30 LAB — GLUCOSE, RANDOM: Glucose: 275 mg/dL — ABNORMAL HIGH (ref 65–99)

## 2017-12-30 NOTE — Progress Notes (Signed)
Called and spoke with the patient over the phone regarding his lipid panel and nonfasting blood sugar.  Patient confirmed that his blood sugar was a nonfasting value.  Discussed the abnormal values, normal values, and recommended he follow-up with his primary care provider and/or endocrinologist within the next week regarding his elevated total cholesterol, LDL cholesterol, and nonfasting blood sugar.  Patient has already been in contact with his endocrinologist regarding his elevated blood sugar but agreed to call his endocrinologist today to follow-up with him regarding his elevated nonfasting blood sugar.

## 2018-05-24 ENCOUNTER — Other Ambulatory Visit: Payer: Self-pay

## 2018-05-24 ENCOUNTER — Emergency Department
Admission: EM | Admit: 2018-05-24 | Discharge: 2018-05-24 | Disposition: A | Discharge status: Home / Self Care | Payer: Managed Care, Other (non HMO) | Attending: Emergency Medicine | Admitting: Emergency Medicine

## 2018-05-24 ENCOUNTER — Emergency Department: Payer: Managed Care, Other (non HMO)

## 2018-05-24 DIAGNOSIS — M25511 Pain in right shoulder: Secondary | ICD-10-CM | POA: Diagnosis present

## 2018-05-24 DIAGNOSIS — Z7982 Long term (current) use of aspirin: Secondary | ICD-10-CM | POA: Diagnosis not present

## 2018-05-24 DIAGNOSIS — Z940 Kidney transplant status: Secondary | ICD-10-CM | POA: Insufficient documentation

## 2018-05-24 DIAGNOSIS — Z7984 Long term (current) use of oral hypoglycemic drugs: Secondary | ICD-10-CM | POA: Insufficient documentation

## 2018-05-24 DIAGNOSIS — I10 Essential (primary) hypertension: Secondary | ICD-10-CM | POA: Diagnosis not present

## 2018-05-24 DIAGNOSIS — E119 Type 2 diabetes mellitus without complications: Secondary | ICD-10-CM | POA: Insufficient documentation

## 2018-05-24 DIAGNOSIS — Z79899 Other long term (current) drug therapy: Secondary | ICD-10-CM | POA: Diagnosis not present

## 2018-05-24 MED ORDER — TRAMADOL HCL 50 MG PO TABS: 50.0000 mg | ORAL_TABLET | ORAL | 0 refills | Status: AC | PRN

## 2018-05-24 NOTE — Discharge Instructions (Signed)
Please wear sling for 2 to 3 days, work on pendulum exercises daily.  Take tramadol and Tylenol as needed.  Apply ice to the shoulder.  Follow-up with orthopedic specialist.

## 2018-05-24 NOTE — ED Provider Notes (Signed)
[facility] EMERGENCY DEPARTMENT Provider Note   CSN: [id number] Arrival date & time: 05/24/18  [id number]     History   Chief Complaint Chief Complaint  Patient presents with  . Shoulder Pain    HPI Francisco Ruiz is a 42 y.o. male.  Presents to the emergency department for evaluation of right shoulder pain.  Patient states this morning he was in the shower washing his hair when he felt a pop along the lateral aspect of the proximal shoulder.  He has a history of small rotator cuff tear that responded well to therapy 1 year ago.  He has had a kidney transplant so does not take NSAIDs.  He is already on steroids.  He denies any neck pain numbness tingling radicular symptoms.  Patient states when he felt a pop in his shoulder he has been unable to abduct or flex the arm overhead.  He has moderate pain with shoulder range of motion.  Pain does not extend below the elbow.  HPI  Past Medical History:  Diagnosis Date  . Alport syndrome   . Anemia of chronic kidney failure   . Bacteremia   . Chronic rejection of kidney transplant   . Diabetes mellitus without complication (HCC)   . Effusion of hand joint   . Gout   . H/O Clostridium difficile infection   . 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 Hypertension   . Respiratory failure [facility](HCC)     Patient Active Problem List   Diagnosis Date Noted  . Intractable abdominal pain 05/26/2016  . Scapular dysfunction 02/07/2015  . Aftercare following organ transplant 01/03/2014  . H/O kidney transplant 01/03/2014  . Essential (primary) hypertension 08/16/2013  . 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 Synovitis or tenosynovitis of wrist 09/27/2012  . Arthritis 09/19/2012  . Gout 09/19/2012  . Effusion of hand joint 09/19/2012  . Absolute anemia 08/15/2012  . Anemia, iron deficiency 05/13/2012  . BP (high blood pressure) 01/26/2012  . DM (diabetes mellitus), secondary  (HCC) 01/26/2012  . Chronic rejection of kidney transplant 01/04/2011    Past Surgical History:  Procedure Laterality Date  . KIDNEY TRANSPLANT    . NEPHRECTOMY TRANSPLANTED ORGAN          Home Medications    Prior to Admission medications   Medication Sig Start Date End Date Taking? Authorizing Provider  allopurinol (ZYLOPRIM) 100 MG tablet Take 100 mg by mouth. 04/20/14   [provider]  aspirin EC 81 MG tablet Take 81 mg by mouth.    [provider]  glipiZIDE (GLUCOTROL) 5 MG tablet Take 5 mg by mouth daily before breakfast.    [provider]  Multiple Vitamin (MULTIVITAMIN) capsule Take by mouth.    [provider]  mycophenolate (CELLCEPT) 250 MG capsule Take 2 tablets (500 mg) every 12 hours. Dx code: z94.0 12/10/14   [provider]  oxyCODONE-acetaminophen (ROXICET) 5-325 MG tablet Take 1-2 tablets by mouth every 6 (six) hours as needed for severe pain. 02/04/17   Sharyn CreamerQuale, Mark, MD  predniSONE (DELTASONE) 5 MG tablet Take 5 mg by mouth 2 (two) times daily with a meal.    [provider]  ranitidine (ZANTAC) 150 MG capsule Take 150 mg by mouth. 07/04/14   [provider]  tacrolimus (PROGRAF) 1 MG capsule Take 4 (1 mg) capsules every 12 hours. DAW1. Brand name medically necessary. Dx code: 14Z94.0. TX date:  5.12.2015 12/18/14   [provider]  traMADol (ULTRAM) 50 MG tablet Take 1-2 tablets (50-100 mg total) by mouth every 4 (four) hours as needed (No more than 8 and 24-hour.). 05/24/18 05/24/19  Evon Slack, PA-C  traZODone (DESYREL) 50 MG tablet Take 50 mg by mouth. 12/15/13   [provider]    Family History Family History  Problem Relation Age of Onset  . Alport syndrome Mother   . Kidney failure Mother   . Diabetes Father   . Hypertension Father     Social History Social History   Tobacco Use  . Smoking status: Never Smoker  . Smokeless tobacco: Never Used  Substance Use Topics    . Alcohol use: No    Alcohol/week: 0.0 standard drinks  . Drug use: Not Currently    Frequency: 3.0 times per week     Allergies   Penicillins; Myfortic [mycophenolate mofetil]; and Oxycodone   Review of Systems Review of Systems  Constitutional: Negative for fever.  Respiratory: Negative for shortness of breath.   Cardiovascular: Negative for chest pain.  Gastrointestinal: Negative for abdominal pain.  Genitourinary: Negative for difficulty urinating, dysuria and urgency.  Musculoskeletal: Positive for arthralgias. Negative for back pain, joint swelling, myalgias and neck pain.  Skin: Negative for rash and wound.  Neurological: Negative for dizziness and headaches.     Physical Exam Updated Vital Signs BP 139/88   Pulse (!) 101   Temp 97.9 F (36.6 C) (Oral)   Resp 14   Ht 5\' 9"  (1.753 m)   Wt 98.9 kg   SpO2 97%   BMI 32.19 kg/m   Physical Exam  Constitutional: He is oriented to person, place, and time. He appears well-developed and well-nourished.  HENT:  Head: Normocephalic and atraumatic.  Eyes: Conjunctivae are normal.  Neck: Normal range of motion.  Cardiovascular: Normal rate.  Pulmonary/Chest: Effort normal. No respiratory distress.  Musculoskeletal:  Right shoulder shows no swelling warmth or erythema.  Limited active range of motion with abduction and flexion.  He has pain with abduction and flexion.  Unable to tolerate all concern empty can test.  Good range of motion of the elbow wrist and digits.  Neurovascular intact right upper extremity.  Neurological: He is alert and oriented to person, place, and time.  Skin: Skin is warm. No rash noted.  Psychiatric: He has a normal mood and affect. His behavior is normal. Thought content normal.     ED Treatments / Results  Labs (all labs ordered are listed, but only abnormal results are displayed) Labs Reviewed - No data to display  EKG None  Radiology Dg Shoulder Right  Result Date:  05/24/2018 CLINICAL DATA:  RIGHT shoulder can pain, felt a pop while in the shower this morning, limited range of motion, increased pain with movement EXAM: RIGHT SHOULDER - 2+ VIEW COMPARISON:  None FINDINGS: Upper normal diameter of the AC joint. AC joint alignment otherwise normal. Osseous mineralization normal. No acute fracture, dislocation, or bone destruction. Visualized RIGHT ribs unremarkable. IMPRESSION: No acute osseous abnormalities. Electronically Signed   By: Ulyses Southward M.D.   On: 05/24/2018 09:08    Procedures Procedures (including critical care time)  Medications Ordered in ED Medications - No data to display   Initial Impression / Assessment and Plan / ED Course  I have reviewed the triage vital signs and the nursing notes.  Pertinent labs & imaging results that were available during my care of the patient were reviewed by  me and considered in my medical decision making (see chart for details).     42 year old male with history of right shoulder rotator cuff tear 1 year ago on MRI.  X-rays obtained by me today show no evidence of acute bony normality.  Complaining of increased pain and popping today with overhead range of motion.  Patient having signs and symptoms consistent with rotator cuff syndrome.  Encourage patient to take tramadol as needed for moderate to severe pain.  Unable to take NSAIDs due to kidney disease/transplant.  Patient will wear sling as needed for comfort and he is encouraged to perform pendulum exercises.  He will follow-up with sports medicine provider at [facility].  Final Clinical Impressions(s) / ED Diagnoses   Final diagnoses:  Acute pain of right shoulder    ED Discharge Orders         Ordered    traMADol (ULTRAM) 50 MG tablet  Every 4 hours PRN     05/24/18 0916           Evon Slack, PA-C 05/24/18 1610    Governor Rooks, MD 05/24/18 1558

## 2018-05-24 NOTE — ED Triage Notes (Signed)
Pt to ED via POV. Pt c/o of right shoulder pain. Pt states when he was in the shower this morning washing his hair he felt his shoulder pop and had immediate pain. Pt states moving arm increases pain. No swelling or deformity noted.

## 2018-05-24 NOTE — ED Notes (Addendum)
FIRST NURSE NOTE:  Pt c/o right shoulder pain, states he felt a pop in his shoulder while getting ready for work. Pt works for [employer]. This is not work related injury.

## 2019-04-23 IMAGING — MR MR HIP*R* W/O CM
5 series · 35 of 40 positions shown · non-contrast
Comparison: Right hip radiographs 01/27/2017

CLINICAL DATA: Posterior right hip pain after twisting accident.
Basketball injury. Severe pain since the incident. Pain increases
with ambulation or weight-bearing.

EXAM:
MR OF THE RIGHT HIP WITHOUT CONTRAST
TECHNIQUE: Multiplanar, multisequence MR imaging was performed. No intravenous
contrast was administered.

[Series 2: T1 · coronal · 4.0mm · 1.48mm/px · 6 of 35 slices shown (1 of 2)]
[im 1/35]
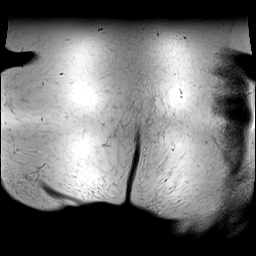
[im 7/35]
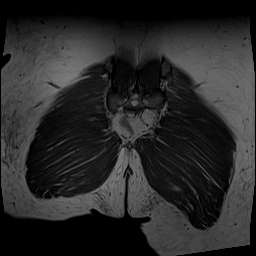
[im 14/35]
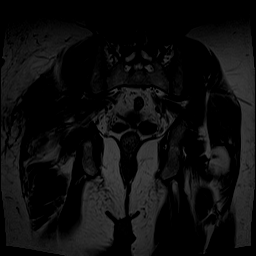
[im 21/35]
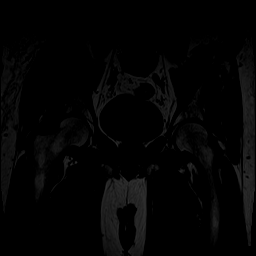
[im 28/35]
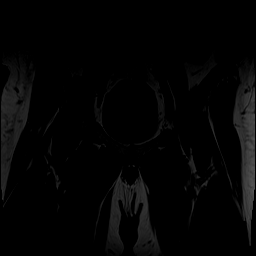
[im 35/35]
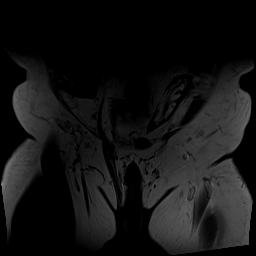

[Series 3: STIR · coronal · 4.0mm · 0.74mm/px · 6 of 35 slices shown]
[im 1/35]
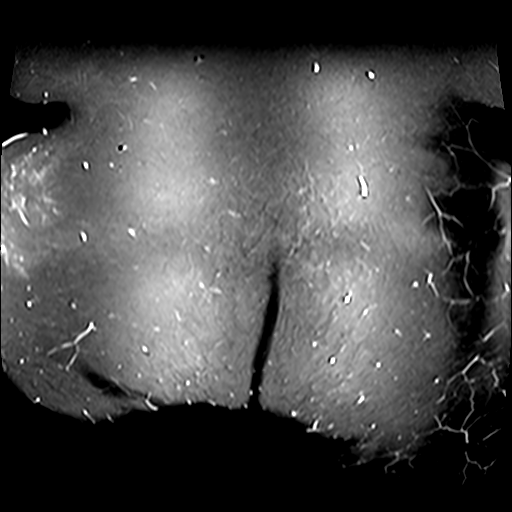
[im 6/35]
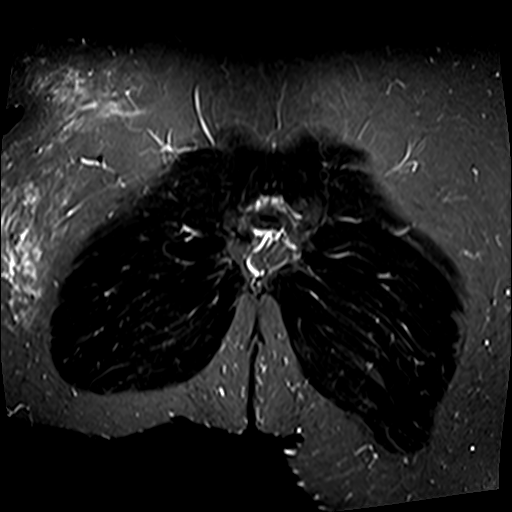
[im 12/35]
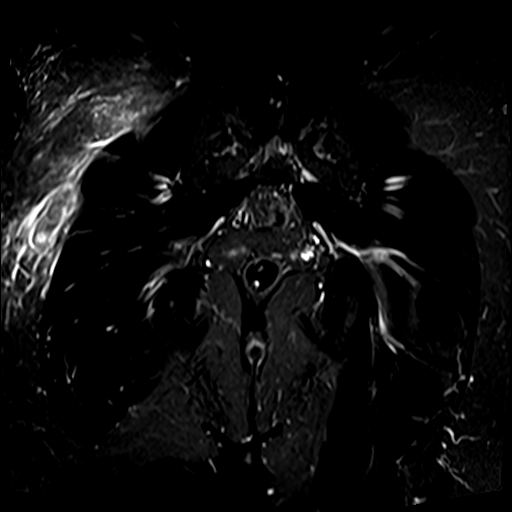
[im 18/35]
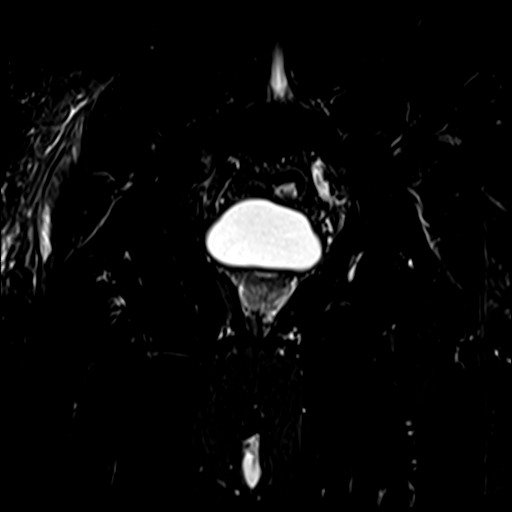
[im 23/35]
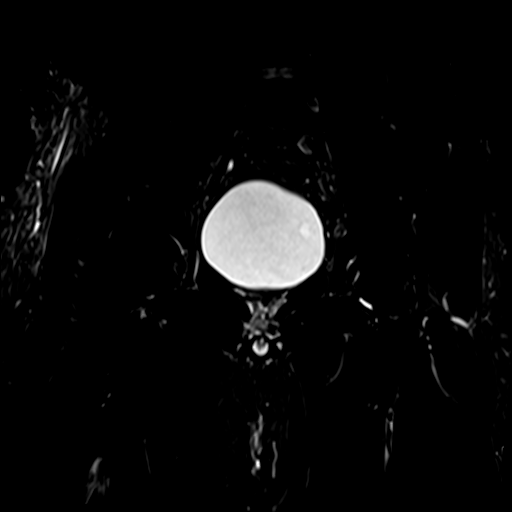
[im 29/35]
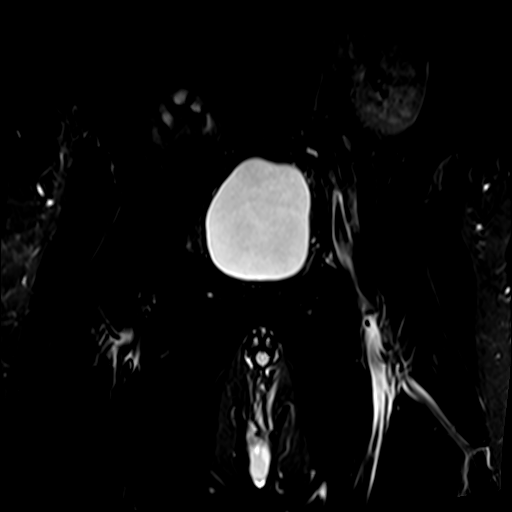

[Series 6: PD fat-sat · sagittal · 4.0mm · 0.55mm/px · 7 of 38 slices shown]
[im 1/38]
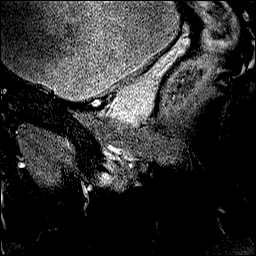
[im 7/38]
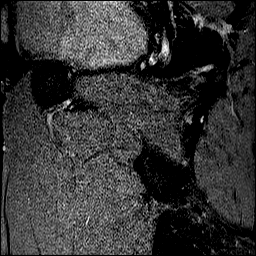
[im 13/38]
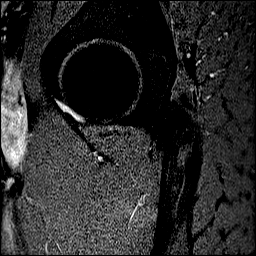
[im 19/38]
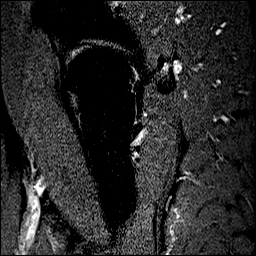
[im 25/38]
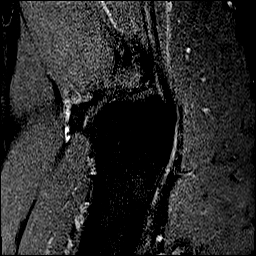
[im 31/38]
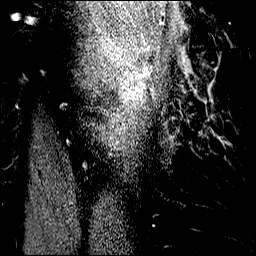
[im 38/38]
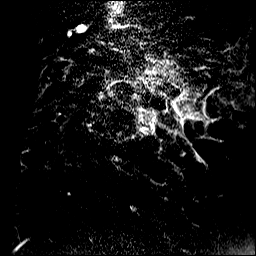

[Series 7: T2 fat-sat · axial · 4.0mm · 0.78mm/px · z∈[-62,+178]mm · 8 of 49 slices shown]
[im 1/49]
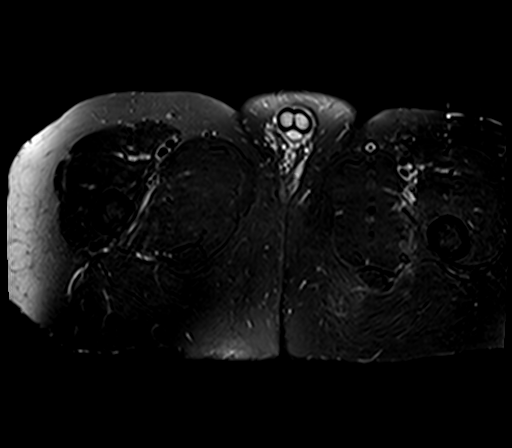
[im 6/49]
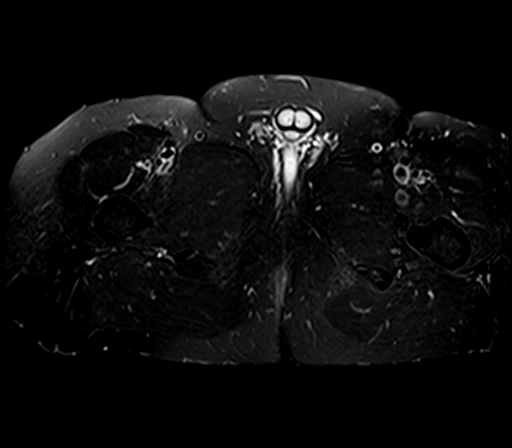
[im 17/49]
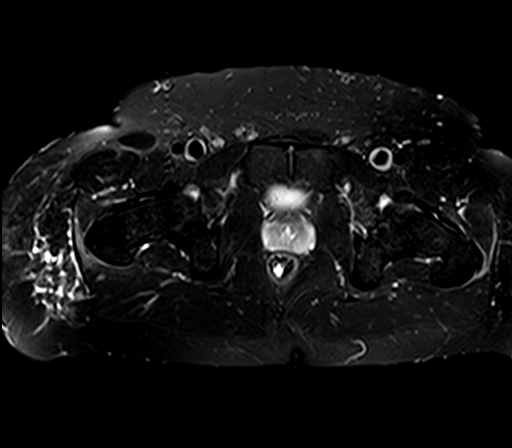
[im 22/49]
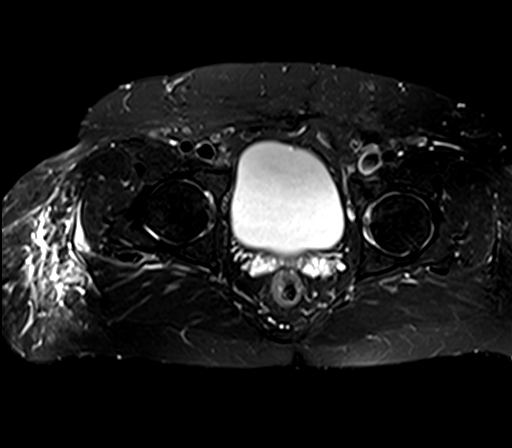
[im 27/49]
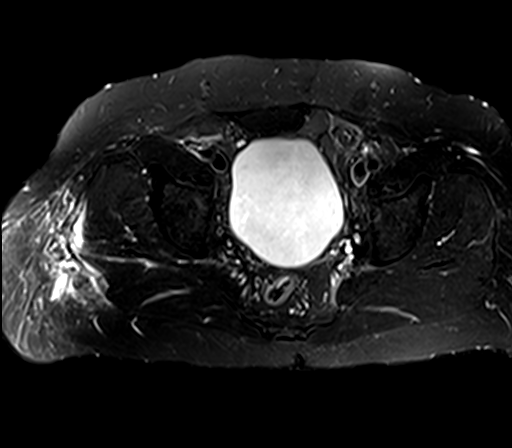
[im 33/49]
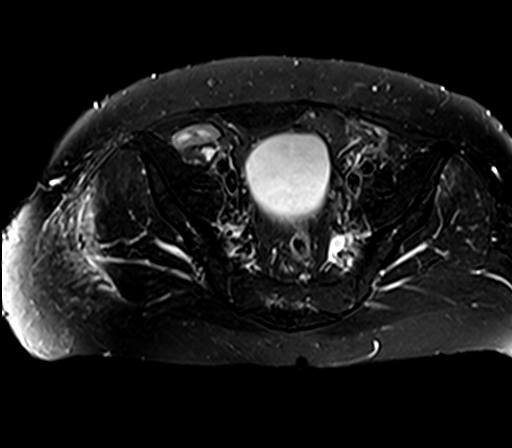
[im 43/49]
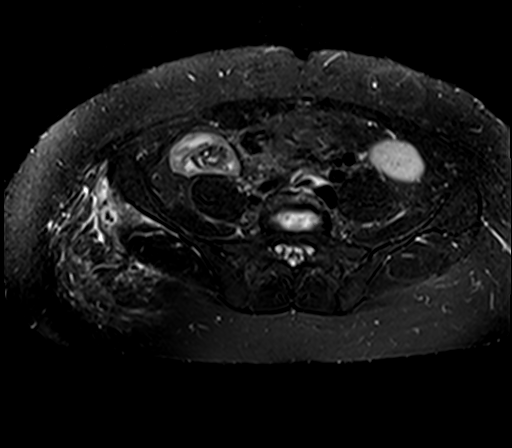
[im 49/49]
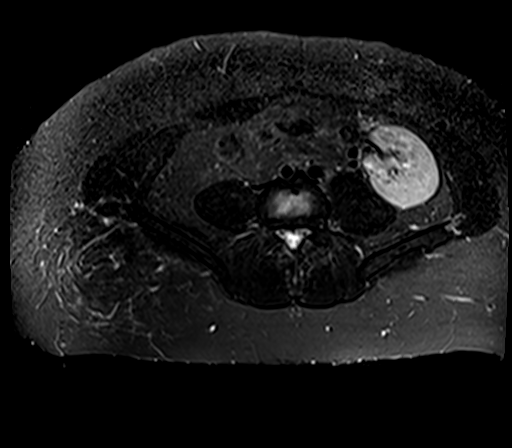

[Series 8: T1 · axial · 4.0mm · 1.56mm/px · z∈[-62,+178]mm · 8 of 49 slices shown (2 of 2)]
[im 1/49]
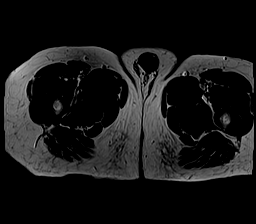
[im 6/49]
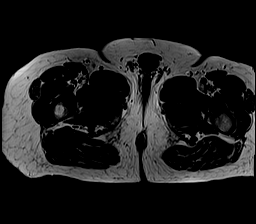
[im 17/49]
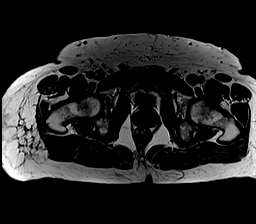
[im 22/49]
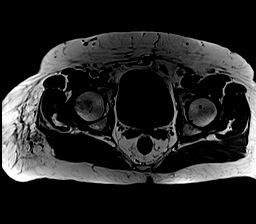
[im 27/49]
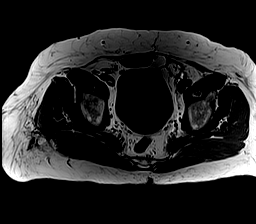
[im 33/49]
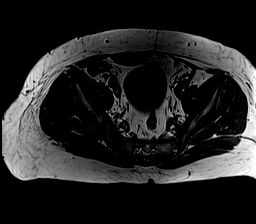
[im 43/49]
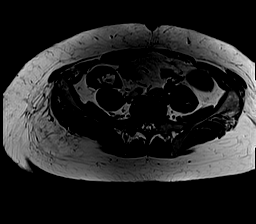
[im 49/49]
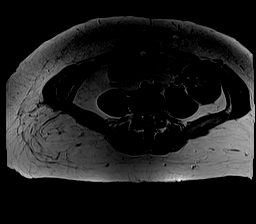

[35 of 40 positions shown; findings below may reference images not displayed]

FINDINGS: Bones: No evidence of acute fracture or dislocation. No bone
contusion. No focal bone lesions.

Articular cartilage and labrum

Articular cartilage: Limited visualization. Appears grossly intact.

Labrum:  Not well visualized.

Joint or bursal effusion

Joint effusion:  No significant effusion.

Bursae:  No bursal collections.

Muscles and tendons

Muscles and tendons: Increased T2 signal intensity and edema
demonstrated in the right gluteus muscles, most prominently in the
gluteus medius. Infiltration and edema extends from the insertion on
the anterior iliac spine down to the greater trochanter. There is
associated edema in the surrounding subcutaneous fat. No loculated
collections are demonstrated. In the setting of trauma these changes
are likely to represent muscular contusion with a partial tear of
the muscle and tendon with possible avulsion at the insertion site.
Myositis would be less likely in the setting of trauma.

Other findings

Miscellaneous: Bilateral pelvic renal transplants with delayed
nephrogram of the right transplant in comparison to the left
suggesting decreased function on the right. Bladder is filled
without wall thickening or filling defect.
IMPRESSION: 1. No acute fracture or dislocation of the right hip.
2. Increased T2 signal intensity and edema in around the right
gluteus muscles, most prominently in the gluteus medius. Changes
likely to represent muscular contusion with partial tear of the
muscle and tendon and possible avulsion at the iliac origin.

## 2019-05-03 ENCOUNTER — Ambulatory Visit: Payer: Managed Care, Other (non HMO) | Admitting: Internal Medicine

## 2019-05-03 ENCOUNTER — Other Ambulatory Visit: Payer: Self-pay

## 2019-05-03 ENCOUNTER — Encounter: Payer: Self-pay | Admitting: Internal Medicine

## 2019-05-03 VITALS — BP 110/76 | HR 68 | Temp 97.8°F | Resp 18 | Ht 69.0 in | Wt 220.0 lb

## 2019-05-03 DIAGNOSIS — Z008 Encounter for other general examination: Secondary | ICD-10-CM

## 2019-05-03 NOTE — Progress Notes (Signed)
[facility]  Patient: Francisco Ruiz, DOB: [birth year]  Subjective:  Here for Biometric Screen/brief exam Patient is a 43 year old male in no acute distress who comes to the clinic for his biometric screening and brief exam. He has a h/o three kidney transplants, HTN, DM, recent rotator cuff repair (on [place]) as part of an extensive problem list which was reviewed. Patient does not regularly see a primary care provider (his left practice a little while back), he does see his kidney doctor regularly, about every 6 months and was seeing endocrine as well pre-Covid, but not more recent past. I strongly advocated for him to see a PCP in the future. 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 No regular exercise otherwise.  He denies any concerns at today's visit. He reports he feels well.  Patient denies any fever, body aches,chills, rash, chest pain, shortness of breath, nausea, vomiting, abdominal pains or diarrhea.  He denies tobacco use, drug use,  alcohol use is rare, like once a month noted at present  Allergies reviewed: Allergies  Allergen Reactions  . Penicillins Rash  . Myfortic [Mycophenolate Mofetil] Itching  . Oxycodone Itching    Medications reviewed: Current Outpatient Medications on File Prior to Visit  Medication Sig Dispense Refill  . famotidine (PEPCID) 20 MG tablet Take 20 mg by mouth 2 (two) times daily.    Marland Kitchen allopurinol (ZYLOPRIM) 100 MG tablet Take 100 mg by mouth.    Marland Kitchen aspirin EC 81 MG tablet Take 81 mg by mouth.    Marland Kitchen glipiZIDE (GLUCOTROL) 5 MG tablet Take 5 mg by mouth daily before breakfast.    . Multiple Vitamin (MULTIVITAMIN) capsule Take by mouth.    . mycophenolate (CELLCEPT) 250 MG capsule Take 2 tablets (500 mg) every 12 hours. Dx code: z94.0    .  oxyCODONE-acetaminophen (ROXICET) 5-325 MG tablet Take 1-2 tablets by mouth every 6 (six) hours as needed for severe pain. (Patient not taking: Reported on 05/03/2019) 20 tablet 0  . predniSONE (DELTASONE) 5 MG tablet Take 5 mg by mouth 2 (two) times daily with a meal.    . ranitidine (ZANTAC) 150 MG capsule Take 150 mg by mouth.    . tacrolimus (PROGRAF) 1 MG capsule Take 4 (1 mg) capsules every 12 hours. DAW1. Brand name medically necessary. Dx code: Z29.0. TX date: 5.12.2015    . traMADol (ULTRAM) 50 MG tablet Take 1-2 tablets (50-100 mg total) by mouth every 4 (four) hours as needed (No more than 8 and 24-hour.). 30 tablet 0  . traZODone (DESYREL) 50 MG tablet Take 50 mg by mouth.     No current facility-administered medications on file prior to visit.    Patient noted no longer taking oxycodone-acetaminophen and ranitidine.  Is now taking famotidine and victoza.   Objective: BP 110/76 (BP Location: Left Arm, Patient Position: Sitting, Cuff Size: Large)   Pulse 68   Temp 97.8 F (36.6 C) (Temporal)   Resp 18   Ht 5\' 9"  (1.753 m)   Wt 220 lb (99.8 kg)   SpO2 98%   BMI 32.49 kg/m   NAD,masked,  Patient is alert and oriented and responsive to questions Engages in eye contact with provider. Speaks in full sentences without any pauses without any shortness of breath or distress.  HEENT: PERRL, EOMI, no sinus tenderness, TMs and canals clear bilateral Neck:  supple, no TM, no anterior or posterior cervical lymphadenopathy. Heart: Regular rate and rhythmwithout murmurs rubs or gallops.  Lungs: Clearto auscultation Neuro:Patient moves on and off of exam table and in room without difficulty. Gait is normal.  Assessment: Biometric screen 1. Encounter for other general examination-brief biometric exam with biometric screening-this is not a full annual physical.  2. Encounter for biometric screening  3. Strongly recommended/encouraged follow-up with a primary care provider  for the medical problems/issues noted above and continue to follow up with his kidney doctor and endocrinologist also recommended.  Plan:  Fasting glucose and lipids  Discussed with patient that today's visit here is a limited biometric screening visit (not a comprehensive exam or management of any chronic problems) Discussed some health issues, including healthy eating habits and importance of staying active. Encouraged to follow-up with a PCP for annual comprehensive preventive and wellness care, and continued follow-up of chronic medical problems/issues as noted above and he plans to.  Also encouraged to share lab results with his PCP and other providers as well. Questions invited and answered.   I will have the office call you on your glucose and cholesterol results when they return (if cannot be sent to your Mychart account). If you have not been contacted within 1 week please call the office.  Please call the clinic during office hours with any questions or concerns.  This biometric physical is a brief physical and the only labs done are glucose and your lipid panel(cholesterol) and isnot a substitute for seeing a primary care provider for a complete annual physical. Please see a primary care physician for routine health maintenance, labs and full physical at least yearly and follow up as recommended by your provider. Provider also recommends if you do not have a primary care provider for patient to establish care as soon as possible .Patient may chose provider of choice. Also,  the [facility] at [phone] or web site at [email or web address] can help assist with finding a primary care doctor.  Patient verbalizes understanding that the office visit is acute care only and not a substitute for a primary care visit or for the management of chronic conditions.

## 2019-05-04 ENCOUNTER — Encounter: Payer: Self-pay | Admitting: Internal Medicine

## 2019-05-04 LAB — LIPID PANEL
Chol/HDL Ratio: 3.1 ratio (ref 0.0–5.0)
Cholesterol, Total: 151 mg/dL (ref 100–199)
HDL: 48 mg/dL (ref >39)
LDL Chol Calc (NIH): 77 mg/dL (ref 0–99)
Triglycerides: 148 mg/dL (ref 0–149)
VLDL Cholesterol Cal: 26 mg/dL (ref 5–40)

## 2019-05-04 LAB — GLUCOSE, RANDOM: Glucose: 407 mg/dL — ABNORMAL HIGH (ref 65–99)

## 2019-05-04 NOTE — Progress Notes (Signed)
Sharing note that was sent to patient via [facility].  Eddie Dibbles,   Attached are your lab results.  The glucose was significantly elevated at 407.  The lipid panel was normal, with all readings in the desired ranges.  You noted you see your kidney doctor regularly and also have an endocrinologist who has helped with your diabetes management in the past.  Please share the blood sugar result with your endocrinologist and kidney doctors by phone today to help get guidance with the best next steps in the management.     Dr. Roxan Hockey

## 2019-05-05 NOTE — Telephone Encounter (Signed)
Francisco Ruiz does not work for [employer] is not associated with a [employer] employee.   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  Epic lists [name], MD as PCP. Other notes signed by Letitia Libra, FNP.  Tried to call [name] & it went straight to voice mail.  Left a call back message.  AMD

## 2019-08-04 ENCOUNTER — Emergency Department: Payer: Managed Care, Other (non HMO)

## 2019-08-04 ENCOUNTER — Emergency Department
Admission: EM | Admit: 2019-08-04 | Discharge: 2019-08-04 | Disposition: A | Discharge status: Home / Self Care | Payer: Managed Care, Other (non HMO) | Attending: Emergency Medicine | Admitting: Emergency Medicine

## 2019-08-04 ENCOUNTER — Other Ambulatory Visit: Payer: Self-pay

## 2019-08-04 DIAGNOSIS — Z7982 Long term (current) use of aspirin: Secondary | ICD-10-CM | POA: Diagnosis not present

## 2019-08-04 DIAGNOSIS — M25511 Pain in right shoulder: Secondary | ICD-10-CM | POA: Insufficient documentation

## 2019-08-04 DIAGNOSIS — I10 Essential (primary) hypertension: Secondary | ICD-10-CM | POA: Insufficient documentation

## 2019-08-04 DIAGNOSIS — Z940 Kidney transplant status: Secondary | ICD-10-CM | POA: Diagnosis not present

## 2019-08-04 DIAGNOSIS — Z79899 Other long term (current) drug therapy: Secondary | ICD-10-CM | POA: Insufficient documentation

## 2019-08-04 DIAGNOSIS — E119 Type 2 diabetes mellitus without complications: Secondary | ICD-10-CM | POA: Diagnosis not present

## 2019-08-04 MED ORDER — DEXAMETHASONE SODIUM PHOSPHATE 10 MG/ML IJ SOLN
20.0000 mg | Freq: Once | INTRAMUSCULAR | Status: AC
  Administered 2019-08-04: 20 mg via INTRAMUSCULAR
  Filled 2019-08-04: qty 2

## 2019-08-04 MED ORDER — FENTANYL CITRATE (PF) 100 MCG/2ML IJ SOLN
100.0000 ug | Freq: Once | INTRAMUSCULAR | Status: AC
  Administered 2019-08-04: 100 ug via INTRAMUSCULAR
  Filled 2019-08-04: qty 2

## 2019-08-04 MED ORDER — PREDNISONE 10 MG (21) PO TBPK: ORAL_TABLET | ORAL | 0 refills | Status: DC

## 2019-08-04 MED ORDER — OXYCODONE-ACETAMINOPHEN 10-325 MG PO TABS: 1.0000 | ORAL_TABLET | Freq: Four times a day (QID) | ORAL | 0 refills | Status: AC | PRN

## 2019-08-04 NOTE — ED Provider Notes (Signed)
Emergency Department Provider Note  ____________________________________________  Time seen: Approximately 7:19 PM  I have reviewed the triage vital signs and the nursing notes.   HISTORY  Chief Complaint Shoulder Pain   Historian Patient     HPI Francisco Ruiz is a 44 y.o. male presents to the emergency department with acute right shoulder pain for the past 2 to 3 days.  Patient reports that he has had a prior right shoulder arthroscopy and right-sided rotator cuff repair in 2019.  Patient reports that he has experienced severe pain of acute onset without provoking trauma.  He denies engaging in heavy lifting.  He denies fever and chills.  No chest pain, chest tightness or shortness of breath.  He took 10 mg of oxycodone prior to presenting to the emergency department which "did not touch the pain".  Patient reports that he has a high tolerance to pain medication.   Past Medical History:  Diagnosis Date  . Alport syndrome   . Anemia of chronic kidney failure   . Bacteremia   . Chronic rejection of kidney transplant   . Diabetes mellitus without complication (HCC)   . Effusion of hand joint   . Gout   . H/O Clostridium difficile infection   . 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 Hypertension   . Respiratory failure (HCC)      Immunizations up to date:  Yes.     Past Medical History:  Diagnosis Date  . Alport syndrome   . Anemia of chronic kidney failure   . Bacteremia   . Chronic rejection of kidney transplant   . Diabetes mellitus without complication (HCC)   . Effusion of hand joint   . Gout   . H/O Clostridium difficile infection   . 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 Hypertension   . Respiratory failure [facility])     Patient Active Problem List   Diagnosis Date Noted  . Intractable abdominal pain 05/26/2016  . Scapular dysfunction  02/07/2015  . Aftercare following organ transplant 01/03/2014  . H/O kidney transplant 01/03/2014  . Essential (primary) hypertension 08/16/2013  . 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 Synovitis or tenosynovitis of wrist 09/27/2012  . Arthritis 09/19/2012  . Gout 09/19/2012  . Effusion of hand joint 09/19/2012  . Absolute anemia 08/15/2012  . Anemia, iron deficiency 05/13/2012  . BP (high blood pressure) 01/26/2012  . DM (diabetes mellitus), secondary (HCC) 01/26/2012  . Chronic rejection of kidney transplant 01/04/2011    Past Surgical History:  Procedure Laterality Date  . KIDNEY TRANSPLANT    . NEPHRECTOMY TRANSPLANTED ORGAN      Prior to Admission medications   Medication Sig Start Date End Date Taking? Authorizing Provider  allopurinol (ZYLOPRIM) 100 MG tablet Take 100 mg by mouth. 04/20/14   [provider]  aspirin EC 81 MG tablet Take 81 mg by mouth.    [provider]  famotidine (PEPCID) 20 MG tablet Take 20 mg by mouth 2 (two) times daily.    [provider]  glipiZIDE (GLUCOTROL) 5 MG tablet Take 5 mg by mouth daily before breakfast.    [provider]  Multiple Vitamin (MULTIVITAMIN) capsule Take by mouth.    [provider]  mycophenolate (CELLCEPT) 250 MG capsule Take 2 tablets (500 mg) every 12 hours. Dx code: z94.0 12/10/14   [provider]  oxyCODONE-acetaminophen (PERCOCET) 10-325 MG tablet Take 1 tablet by mouth every 6 (six) hours as needed for up to 3 days for pain. 08/04/19 08/07/19  Orvil Feil, PA-C  predniSONE (STERAPRED UNI-PAK 21 TAB) 10 MG (21) TBPK tablet Take 6 tablets the first day, take 5 tablets the second day, take 4 tablets the third day, take 3 tablets the fourth day, take 2 tablets the fifth day, take 1 tablet the sixth day. 08/04/19   Orvil Feil, PA-C  ranitidine (ZANTAC) 150 MG capsule Take 150 mg by mouth. 07/04/14   [provider]  tacrolimus (PROGRAF) 1 MG capsule Take 4 (1 mg)  capsules every 12 hours. DAW1. Brand name medically necessary. Dx code: Z69.0. TX date: 5.12.2015 12/18/14   [provider]  traZODone (DESYREL) 50 MG tablet Take 50 mg by mouth. 12/15/13   [provider]    Allergies Penicillins, Myfortic [mycophenolate mofetil], and Oxycodone  Family History  Problem Relation Age of Onset  . Alport syndrome Mother   . Kidney failure Mother   . Diabetes Father   . Hypertension Father     Social History Social History   Tobacco Use  . Smoking status: Never Smoker  . Smokeless tobacco: Never Used  Substance Use Topics  . Alcohol use: No    Alcohol/week: 0.0 standard drinks  . Drug use: Not Currently    Frequency: 3.0 times per week     Review of Systems  Constitutional: No fever/chills Eyes:  No discharge ENT: No upper respiratory complaints. Respiratory: no cough. No SOB/ use of accessory muscles to breath Gastrointestinal:   No nausea, no vomiting.  No diarrhea.  No constipation. Musculoskeletal: Patient has right shoulder pain.  Skin: Negative for rash, abrasions, lacerations, ecchymosis.   ____________________________________________   PHYSICAL EXAM:  VITAL SIGNS: ED Triage Vitals  Enc Vitals Group     BP 08/04/19 1730 136/86     Pulse Rate 08/04/19 1730 78     Resp --      Temp 08/04/19 1730 (!) 97.5 F (36.4 C)     Temp Source 08/04/19 1730 Oral     SpO2 08/04/19 1730 98 %     Weight 08/04/19 1735 208 lb (94.3 kg)     Height 08/04/19 1735 5\' 9"  (1.753 m)     Head Circumference --      Peak Flow --      Pain Score 08/04/19 1734 8     Pain Loc --      Pain Edu? --      Excl. in GC? --      Constitutional: Alert and oriented. Well appearing and in no acute distress. Eyes: Conjunctivae are normal. PERRL. EOMI. Head: Atraumatic. Cardiovascular: Normal rate, regular rhythm. Normal S1 and S2.  Good peripheral circulation. Respiratory: Normal respiratory effort without tachypnea or retractions. Lungs  CTAB. Good air entry to the bases with no decreased or absent breath sounds Gastrointestinal: Bowel sounds x 4 quadrants. Soft and nontender to palpation. No guarding or rigidity. No distention. Musculoskeletal: Patient demonstrates full range of motion of the right shoulder.  He did have some mild supraspinatus weakness with testing but no weakness of the infraspinatus, subscapularis and teres minor.  He also performs full range of motion of the right elbow and right wrist.  Palpable radial pulse, right. Neurologic:  Normal for age. No gross focal neurologic deficits are appreciated.  Skin:  Skin is warm, dry and intact. No rash noted. Psychiatric:  Mood and affect are normal for age. Speech and behavior are normal.   ____________________________________________   LABS (all labs ordered are listed, but only abnormal results are displayed)  Labs Reviewed - No data to display ____________________________________________  EKG   ____________________________________________  RADIOLOGY Unk Pinto, personally viewed and evaluated these images (plain radiographs) as part of my medical decision making, as well as reviewing the written report by the radiologist.  DG Shoulder Right  Result Date: 08/04/2019 CLINICAL DATA:  Right shoulder pain and decreased range of motion. No known injury. EXAM: RIGHT SHOULDER - 2+ VIEW COMPARISON:  05/24/2018 FINDINGS: There is no evidence of fracture or dislocation. There is no evidence of arthropathy or other focal bone abnormality. Soft tissues are unremarkable. IMPRESSION: Negative. Electronically Signed   By: Marlaine Hind M.D.   On: 08/04/2019 18:56    ____________________________________________    PROCEDURES  Procedure(s) performed:     Procedures     Medications  fentaNYL (SUBLIMAZE) injection 100 mcg (100 mcg Intramuscular Given 08/04/19 1847)  dexamethasone (DECADRON) injection 20 mg (20 mg Intramuscular Given 08/04/19 1843)      ____________________________________________   INITIAL IMPRESSION / ASSESSMENT AND PLAN / ED COURSE  Pertinent labs & imaging results that were available during my care of the patient were reviewed by me and considered in my medical decision making (see chart for details).      Assessment and Plan:  Right shoulder pain:  44 year old male presents to the emergency department with right shoulder pain that is occurred without trauma for the past 2 days.  Patient's vital signs were reassuring at triage.  He was not hypertensive or tachycardic.  X-ray examination of the right shoulder revealed no bony abnormality.  Patient felt comfortable following up with his orthopedic surgeon, likely for an outpatient MRI to reassess rotator cuff.  Patient was given 100 mcg of fentanyl and only reported minimal improvement in his pain.  He was also given 20 mg of Decadron.  Patient cannot tolerate anti-inflammatories due to extensive history of kidney replacement and rejection.  Patient was given a short course of oxycodone until he can follow-up with his orthopedic surgeon.  Return precautions were given for new or worsening symptoms.  All patient questions were answered.   ____________________________________________  FINAL CLINICAL IMPRESSION(S) / ED DIAGNOSES  Final diagnoses:  Acute pain of right shoulder      NEW MEDICATIONS STARTED DURING THIS VISIT:  ED Discharge Orders         Ordered    oxyCODONE-acetaminophen (PERCOCET) 10-325 MG tablet  Every 6 hours PRN     08/04/19 1916    predniSONE (STERAPRED UNI-PAK 21 TAB) 10 MG (21) TBPK tablet     08/04/19 1916              This chart was dictated using voice recognition software/Dragon. Despite best efforts to proofread, errors can occur which can change the meaning. Any change was purely unintentional.     Lannie Fields, PA-C 08/04/19 1952    Earleen Newport, MD 08/04/19 2225

## 2019-08-04 NOTE — ED Triage Notes (Signed)
Pt to the er for pain to the right shoulder that runs down the right bicep. Pain is worse with movement.

## 2019-08-04 NOTE — ED Triage Notes (Signed)
First nurse note-  Pt here for right shoulder pain. Hx of surgery to this shoulder.  No new injury.  Pain worse with movement.

## 2020-04-29 ENCOUNTER — Ambulatory Visit
Admission: EM | Admit: 2020-04-29 | Discharge: 2020-04-29 | Disposition: A | Discharge status: Home / Self Care | Payer: Managed Care, Other (non HMO) | Attending: Emergency Medicine | Admitting: Emergency Medicine

## 2020-04-29 ENCOUNTER — Other Ambulatory Visit: Payer: Self-pay

## 2020-04-29 ENCOUNTER — Encounter: Payer: Self-pay | Admitting: Emergency Medicine

## 2020-04-29 DIAGNOSIS — Z23 Encounter for immunization: Secondary | ICD-10-CM | POA: Diagnosis not present

## 2020-04-29 DIAGNOSIS — S61011A Laceration without foreign body of right thumb without damage to nail, initial encounter: Secondary | ICD-10-CM

## 2020-04-29 MED ORDER — CEPHALEXIN 500 MG PO CAPS: 500.0000 mg | ORAL_CAPSULE | Freq: Three times a day (TID) | ORAL | 0 refills | Status: AC

## 2020-04-29 MED ORDER — TETANUS-DIPHTH-ACELL PERTUSSIS 5-2.5-18.5 LF-MCG/0.5 IM SUSY
0.5000 mL | PREFILLED_SYRINGE | Freq: Once | INTRAMUSCULAR | Status: AC
  Administered 2020-04-29: 0.5 mL via INTRAMUSCULAR

## 2020-04-29 NOTE — ED Triage Notes (Signed)
Patient stated he cut his right thumb on hedge trimmers about 30 min ago.

## 2020-04-29 NOTE — Discharge Instructions (Addendum)
Leave the dressing in place for the next 24 hours.  After that remove the dressing and wash the wounds with soap and water.  Allowed to thoroughly dry.  Apply bacitracin to the sutured lacerations daily for the next 2 days until a scab starts to form.  Once a scab has formed keep the lacerations covered with a nonadherent dressing while at work and leave it open to the air at home.  Take the Keflex 3 times a day for the next 10 days to prevent wound infection.  If you develop increased pain, redness, swelling, drainage, red streaks ascending your arm, or fever return for reevaluation.

## 2020-04-29 NOTE — ED Provider Notes (Signed)
[facility]    CSN: [id number] Arrival date & time: 04/29/20  1126      History   Chief Complaint Chief Complaint  Patient presents with  . finger laceration    HPI Francisco Ruiz is a 44 y.o. male.   44 year old male here for evaluation of a right thumb laceration.  Patient reports that he was using a Counsellorhedge trimmer, he had set down the hedge trimmer and was reaching down when the blade saw around and cut his right thumb.  Patient denies numbness or tingling, he has full use of his thumb, he was bleeding significantly upon arrival but bleeding has stopped with direct pressure.  Patient is on a baby aspirin daily.     Past Medical History:  Diagnosis Date  . Alport syndrome   . Anemia of chronic kidney failure   . Bacteremia   . Chronic rejection of kidney transplant   . Diabetes mellitus without complication (HCC)   . Effusion of hand joint   . Gout   . H/O Clostridium difficile infection   . 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 Hypertension   . Respiratory failure [facility](HCC)     Patient Active Problem List   Diagnosis Date Noted  . Intractable abdominal pain 05/26/2016  . Scapular dysfunction 02/07/2015  . Aftercare following organ transplant 01/03/2014  . H/O kidney transplant 01/03/2014  . Essential (primary) hypertension 08/16/2013  . 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 Synovitis or tenosynovitis of wrist 09/27/2012  . Arthritis 09/19/2012  . Gout 09/19/2012  . Effusion of hand joint 09/19/2012  . Absolute anemia 08/15/2012  . Anemia, iron deficiency 05/13/2012  . BP (high blood pressure) 01/26/2012  . DM (diabetes mellitus), secondary (HCC) 01/26/2012  . Chronic rejection of kidney transplant 01/04/2011    Past Surgical History:  Procedure Laterality Date  . KIDNEY TRANSPLANT    . NEPHRECTOMY TRANSPLANTED ORGAN         Home Medications    Prior to Admission medications    Medication Sig Start Date End Date Taking? Authorizing Provider  allopurinol (ZYLOPRIM) 100 MG tablet Take 100 mg by mouth. 04/20/14  Yes [provider]  aspirin EC 81 MG tablet Take 81 mg by mouth.   Yes [provider]  famotidine (PEPCID) 20 MG tablet Take 20 mg by mouth 2 (two) times daily.   Yes [provider]  glipiZIDE (GLUCOTROL) 5 MG tablet Take 5 mg by mouth daily before breakfast.   Yes [provider]  mycophenolate (CELLCEPT) 250 MG capsule Take 2 tablets (500 mg) every 12 hours. Dx code: z94.0 12/10/14  Yes [provider]  tacrolimus (PROGRAF) 1 MG capsule Take 4 (1 mg) capsules every 12 hours. DAW1. Brand name medically necessary. Dx code: 16Z94.0. TX date: 5.12.2015 12/18/14  Yes [provider]  traZODone (DESYREL) 50 MG tablet Take 50 mg by mouth. 12/15/13  Yes [provider]  cephALEXin (KEFLEX) 500 MG capsule Take 1 capsule (500 mg total) by mouth 3 (three) times daily. 04/29/20   Becky Augustayan, Licet Dunphy, NP  Multiple Vitamin (MULTIVITAMIN) capsule Take by mouth.    [provider]  ranitidine (ZANTAC) 150 MG capsule Take 150 mg by mouth. 07/04/14 04/29/20  [provider]    Family History Family History  Problem Relation Age of Onset  . Alport syndrome Mother   . Kidney failure Mother   . Diabetes Father   .  Hypertension Father     Social History Social History   Tobacco Use  . Smoking status: Never Smoker  . Smokeless tobacco: Never Used  Vaping Use  . Vaping Use: Never used  Substance Use Topics  . Alcohol use: No    Alcohol/week: 0.0 standard drinks  . Drug use: Not Currently    Frequency: 3.0 times per week     Allergies   Penicillins, Myfortic [mycophenolate mofetil], and Oxycodone   Review of Systems Review of Systems  Constitutional: Negative for activity change, appetite change and fever.  HENT: Negative for congestion and rhinorrhea.   Respiratory: Negative for wheezing.    Cardiovascular: Negative for chest pain.  Gastrointestinal: Negative for nausea and vomiting.  Musculoskeletal: Positive for arthralgias. Negative for joint swelling and myalgias.       She has tenderness to the IP joint of the right thumb.  Skin: Positive for wound.       She has 2 cats on his right thumb.  One at the distal joint and another in the middle of the thumb.  Neurological: Negative for weakness and numbness.  Hematological: Negative.   Psychiatric/Behavioral: Negative.      Physical Exam Triage Vital Signs ED Triage Vitals  Enc Vitals Group     BP 04/29/20 1203 128/88     Pulse Rate 04/29/20 1203 (!) 112     Resp 04/29/20 1203 18     Temp 04/29/20 1203 98.2 F (36.8 C)     Temp Source 04/29/20 1203 Oral     SpO2 04/29/20 1203 99 %     Weight 04/29/20 1142 235 lb (106.6 kg)     Height 04/29/20 1142 5\' 9"  (1.753 m)     Head Circumference --      Peak Flow --      Pain Score 04/29/20 1142 4     Pain Loc --      Pain Edu? --      Excl. in GC? --    No data found.  Updated Vital Signs BP 128/88 (BP Location: Right Arm)   Pulse (!) 112   Temp 98.2 F (36.8 C) (Oral)   Resp 18   Ht 5\' 9"  (1.753 m)   Wt 235 lb (106.6 kg)   SpO2 99%   BMI 34.70 kg/m   Visual Acuity Right Eye Distance:   Left Eye Distance:   Bilateral Distance:    Right Eye Near:   Left Eye Near:    Bilateral Near:     Physical Exam Vitals and nursing note reviewed.  Constitutional:      General: He is not in acute distress.    Appearance: Normal appearance. He is not toxic-appearing.  HENT:     Head: Normocephalic and atraumatic.  Eyes:     General: No scleral icterus.    Extraocular Movements: Extraocular movements intact.     Conjunctiva/sclera: Conjunctivae normal.     Pupils: Pupils are equal, round, and reactive to light.  Cardiovascular:     Rate and Rhythm: Normal rate and regular rhythm.     Pulses: Normal pulses.     Heart sounds: Normal heart sounds. No murmur  heard.  No gallop.   Pulmonary:     Effort: Pulmonary effort is normal.     Breath sounds: Normal breath sounds. No wheezing, rhonchi or rales.  Musculoskeletal:        General: Tenderness and signs of injury present. Normal range of motion.  Cervical back: Normal range of motion and neck supple.     Comments: Patient has a laceration to the lateral aspect of the right thumb IP joint and a second laceration that is in the proximal phalanx near the MCP joint.  Bleeding is controlled.  Wound edges are clean and no debris observed in the wound.  Proximal wound measures 3 cm.  Distal wound measures 5 cm.  Skin:    General: Skin is warm and dry.     Capillary Refill: Capillary refill takes less than 2 seconds.  Neurological:     General: No focal deficit present.     Mental Status: He is alert and oriented to person, place, and time.     Sensory: No sensory deficit.     Comments: Patient has full range of motion to his right thumb.  He has strong flexion and extension.  Psychiatric:        Mood and Affect: Mood normal.        Behavior: Behavior normal.        Thought Content: Thought content normal.        Judgment: Judgment normal.      UC Treatments / Results  Labs (all labs ordered are listed, but only abnormal results are displayed) Labs Reviewed - No data to display  EKG   Radiology No results found.  Procedures Laceration Repair  Date/Time: 04/29/2020 1:37 PM Performed by: Becky Augusta, NP Authorized by: Becky Augusta, NP   Consent:    Consent obtained:  Verbal   Consent given by:  Patient   Risks discussed:  Infection, pain, retained foreign body, nerve damage, need for additional repair, poor wound healing and tendon damage   Alternatives discussed:  No treatment Anesthesia (see MAR for exact dosages):    Anesthesia method:  Local infiltration   Local anesthetic:  Lidocaine 1% w/o epi (Digital block with 4 mils lidocaine.  Additional IntraSite block with 3 mL  lidocaine.) Laceration details:    Location:  Finger   Finger location:  R thumb   Wound length (cm): Proximal wound is 3 cm, distal wound over the IP joint is 5 cm.   Laceration depth: Proximal wound is intradermal.  Distal wound is into the subcutaneous layer. Repair type:    Repair type:  Simple Pre-procedure details:    Preparation:  Patient was prepped and draped in usual sterile fashion Exploration:    Hemostasis achieved with:  Direct pressure   Wound exploration: wound explored through full range of motion and entire depth of wound probed and visualized     Wound exploration comment:  Distal wound is deeper.  Wound explored for foreign bodies of which there were none.  There is no visible tendon involvement or violation of the joint capsule.   Contaminated: no   Treatment:    Area cleansed with:  Shur-Clens   Amount of cleaning:  Standard Skin repair:    Repair method:  Sutures   Suture size:  5-0   Suture material:  Prolene   Suture technique:  Simple interrupted (Proximal wound closed with 3 simple interrupted sutures.  Distal wound closed with 8 simple interrupted sutures.) Approximation:    Approximation:  Close Post-procedure details:    Dressing:  Antibiotic ointment, non-adherent dressing and splint for protection (Wound cleansed with saline, antibiotic ointment applied to both lacerations, nonadherent dressing secured with Coban, AlumaFoam splint fashioned to protect thumb and secured with Coban.)   Patient tolerance of procedure:  Tolerated well,  no immediate complications   (including critical care time)  Medications Ordered in UC Medications  Tdap (BOOSTRIX) injection 0.5 mL (0.5 mLs Intramuscular Given 04/29/20 1149)    Initial Impression / Assessment and Plan / UC Course  I have reviewed the triage vital signs and the nursing notes.  Pertinent labs & imaging results that were available during my care of the patient were reviewed by me and considered in my  medical decision making (see chart for details).   Patient is here for evaluation of 2 lacerations he sustained to his right thumb while using a hedge trimmer.  He reports that he put the hedge tremor down and thinks he might have kicked it with his foot causing the blade to swing around and it caught his right thumb in 2 places.  Patient has full range of motion and full sensation.  Bleeding is controlled.  Patient is on a baby aspirin daily for renal transplant.  Tetanus updated in clinic today.  Will perform digital block and explore the wounds to ensure there is no tendon damage.  Plan is to close with Prolene and simple sutures.   Final Clinical Impressions(s) / UC Diagnoses   Final diagnoses:  Thumb laceration, right, initial encounter     Discharge Instructions     Leave the dressing in place for the next 24 hours.  After that remove the dressing and wash the wounds with soap and water.  Allowed to thoroughly dry.  Apply bacitracin to the sutured lacerations daily for the next 2 days until a scab starts to form.  Once a scab has formed keep the lacerations covered with a nonadherent dressing while at work and leave it open to the air at home.  Take the Keflex 3 times a day for the next 10 days to prevent wound infection.  If you develop increased pain, redness, swelling, drainage, red streaks ascending your arm, or fever return for reevaluation.    ED Prescriptions    Medication Sig Dispense Auth. Provider   cephALEXin (KEFLEX) 500 MG capsule Take 1 capsule (500 mg total) by mouth 3 (three) times daily. 20 capsule Becky Augusta, NP     PDMP not reviewed this encounter.   Becky Augusta, NP 04/29/20 1344

## 2020-06-15 ENCOUNTER — Emergency Department
Admission: EM | Admit: 2020-06-15 | Discharge: 2020-06-15 | Disposition: A | Discharge status: Home / Self Care | Payer: Managed Care, Other (non HMO) | Attending: Emergency Medicine | Admitting: Emergency Medicine

## 2020-06-15 ENCOUNTER — Other Ambulatory Visit: Payer: Self-pay

## 2020-06-15 ENCOUNTER — Encounter: Payer: Self-pay | Admitting: Emergency Medicine

## 2020-06-15 DIAGNOSIS — E119 Type 2 diabetes mellitus without complications: Secondary | ICD-10-CM | POA: Insufficient documentation

## 2020-06-15 DIAGNOSIS — Y939 Activity, unspecified: Secondary | ICD-10-CM | POA: Insufficient documentation

## 2020-06-15 DIAGNOSIS — Z79899 Other long term (current) drug therapy: Secondary | ICD-10-CM | POA: Insufficient documentation

## 2020-06-15 DIAGNOSIS — Z7984 Long term (current) use of oral hypoglycemic drugs: Secondary | ICD-10-CM | POA: Insufficient documentation

## 2020-06-15 DIAGNOSIS — I129 Hypertensive chronic kidney disease with stage 1 through stage 4 chronic kidney disease, or unspecified chronic kidney disease: Secondary | ICD-10-CM | POA: Diagnosis not present

## 2020-06-15 DIAGNOSIS — M25511 Pain in right shoulder: Secondary | ICD-10-CM | POA: Diagnosis present

## 2020-06-15 DIAGNOSIS — Z7982 Long term (current) use of aspirin: Secondary | ICD-10-CM | POA: Insufficient documentation

## 2020-06-15 DIAGNOSIS — N189 Chronic kidney disease, unspecified: Secondary | ICD-10-CM | POA: Diagnosis not present

## 2020-06-15 DIAGNOSIS — D631 Anemia in chronic kidney disease: Secondary | ICD-10-CM | POA: Diagnosis not present

## 2020-06-15 MED ORDER — DEXAMETHASONE SODIUM PHOSPHATE 10 MG/ML IJ SOLN
10.0000 mg | Freq: Once | INTRAMUSCULAR | Status: AC
  Administered 2020-06-15: 10:00:00 10 mg via INTRAMUSCULAR
  Filled 2020-06-15: qty 1

## 2020-06-15 MED ORDER — FENTANYL CITRATE (PF) 100 MCG/2ML IJ SOLN
100.0000 ug | Freq: Once | INTRAMUSCULAR | Status: AC
  Administered 2020-06-15: 10:00:00 100 ug via INTRAMUSCULAR
  Filled 2020-06-15: qty 2

## 2020-06-15 MED ORDER — OXYCODONE-ACETAMINOPHEN 10-325 MG PO TABS: 1.0000 | ORAL_TABLET | Freq: Three times a day (TID) | ORAL | 0 refills | Status: AC | PRN

## 2020-06-15 NOTE — ED Triage Notes (Signed)
Has history of surgery to right shoulder.  States pain never resolved after surgery.  Has had MRI to shoulder.  Arrives today with c/o pain to shoulder.  Has history of pain exacerbations to shoulder.  States normally hydrocodone relieves pain, but currently not helping.  Has been treated through ED for the same in the past, treatment helped.

## 2020-06-15 NOTE — ED Provider Notes (Signed)
[facility] Emergency Department Provider Note   ____________________________________________    I have reviewed the triage vital signs and the nursing notes.   HISTORY  Chief Complaint Shoulder Pain     HPI Francisco Ruiz is a 44 y.o. male with a history of a renal transplant and additional history as noted below who presents with complaints of right shoulder pain.  Patient has chronic right shoulder pain and occasionally has exacerbations, this has been going on for years.  He reports he is having his typical pain currently.  Has had successful treatment in the emergency department in the past for acute exacerbation  Past Medical History:  Diagnosis Date  . Alport syndrome   . Anemia of chronic kidney failure   . Bacteremia   . Chronic rejection of kidney transplant   . Diabetes mellitus without complication (HCC)   . Effusion of hand joint   . Gout   . H/O Clostridium difficile infection   . 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 Hypertension   . Respiratory failure [facility])     Patient Active Problem List   Diagnosis Date Noted  . Intractable abdominal pain 05/26/2016  . Scapular dysfunction 02/07/2015  . Aftercare following organ transplant 01/03/2014  . H/O kidney transplant 01/03/2014  . Essential (primary) hypertension 08/16/2013  . 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 Synovitis or tenosynovitis of wrist 09/27/2012  . Arthritis 09/19/2012  . Gout 09/19/2012  . Effusion of hand joint 09/19/2012  . Absolute anemia 08/15/2012  . Anemia, iron deficiency 05/13/2012  . BP (high blood pressure) 01/26/2012  . DM (diabetes mellitus), secondary (HCC) 01/26/2012  . Chronic rejection of kidney transplant 01/04/2011    Past Surgical History:  Procedure Laterality Date  . KIDNEY TRANSPLANT    . NEPHRECTOMY TRANSPLANTED ORGAN      Prior to Admission medications   Medication Sig Start  Date End Date Taking? Authorizing Provider  allopurinol (ZYLOPRIM) 100 MG tablet Take 100 mg by mouth. 04/20/14   [provider]  aspirin EC 81 MG tablet Take 81 mg by mouth.    [provider]  cephALEXin (KEFLEX) 500 MG capsule Take 1 capsule (500 mg total) by mouth 3 (three) times daily. 04/29/20   Becky Augusta, NP  famotidine (PEPCID) 20 MG tablet Take 20 mg by mouth 2 (two) times daily.    [provider]  glipiZIDE (GLUCOTROL) 5 MG tablet Take 5 mg by mouth daily before breakfast.    [provider]  Multiple Vitamin (MULTIVITAMIN) capsule Take by mouth.    [provider]  mycophenolate (CELLCEPT) 250 MG capsule Take 2 tablets (500 mg) every 12 hours. Dx code: z94.0 12/10/14   [provider]  oxyCODONE-acetaminophen (PERCOCET) 10-325 MG tablet Take 1 tablet by mouth every 8 (eight) hours as needed for up to 5 days for pain. 06/15/20 06/20/20  Jene Every, MD  tacrolimus (PROGRAF) 1 MG capsule Take 4 (1 mg) capsules every 12 hours. DAW1. Brand name medically necessary. Dx code: Z81.0. TX date: 5.12.2015 12/18/14   [provider]  traZODone (DESYREL) 50 MG tablet Take 50 mg by mouth. 12/15/13   [provider]  ranitidine (ZANTAC) 150 MG capsule Take 150 mg by mouth. 07/04/14 04/29/20  [provider]     Allergies Penicillins, Myfortic [mycophenolate mofetil], and Oxycodone  Family History  Problem Relation Age of Onset  . Alport syndrome  Mother   . Kidney failure Mother   . Diabetes Father   . Hypertension Father     Social History Social History   Tobacco Use  . Smoking status: Never Smoker  . Smokeless tobacco: Never Used  Vaping Use  . Vaping Use: Never used  Substance Use Topics  . Alcohol use: No    Alcohol/week: 0.0 standard drinks  . Drug use: Not Currently    Frequency: 3.0 times per week    Review of Systems  Constitutional: No fever/chills  Cardiovascular: Denies chest  pain. Respiratory: Denies shortness of breath.  Genitourinary: Negative for dysuria. Musculoskeletal: As above Skin: Negative for rash. Neurological: Negative for headaches or weakness   ____________________________________________   PHYSICAL EXAM:  VITAL SIGNS: ED Triage Vitals  Enc Vitals Group     BP 06/15/20 0728 (!) 160/100     Pulse Rate 06/15/20 0728 85     Resp 06/15/20 0728 16     Temp 06/15/20 0728 97.7 F (36.5 C)     Temp Source 06/15/20 0728 Oral     SpO2 06/15/20 0728 96 %     Weight 06/15/20 0727 106.6 kg (235 lb 0.2 oz)     Height 06/15/20 0727 1.753 m (5\' 9" )     Head Circumference --      Peak Flow --      Pain Score 06/15/20 0726 8     Pain Loc --      Pain Edu? --      Excl. in GC? --     Constitutional: Alert and oriented. No acute distress. Pleasant and interactive   Neck:  Painless ROM Cardiovascular: Normal rate, regular rhythm.  Good peripheral circulation. Respiratory: Normal respiratory effort.  No retractions.    Musculoskeletal: Overall good range of motion of the right shoulder, no significant swelling, normal pulses distally Neurologic:  Normal speech and language. No gross focal neurologic deficits are appreciated.  Skin:  Skin is warm, dry and intact. No rash noted. Psychiatric: Mood and affect are normal. Speech and behavior are normal.  ____________________________________________   LABS (all labs ordered are listed, but only abnormal results are displayed)  Labs Reviewed - No data to display ____________________________________________  EKG  None ____________________________________________  RADIOLOGY  None ____________________________________________   PROCEDURES  Procedure(s) performed: No  Procedures   Critical Care performed: No ____________________________________________   INITIAL IMPRESSION / ASSESSMENT AND PLAN / ED COURSE  Pertinent labs & imaging results that were available during my care of  the patient were reviewed by me and considered in my medical decision making (see chart for details).  Patient with acute on chronic right shoulder pain, has not been in the emergency department since January of early 2021, will treat with IM Decadron, IM analgesics, discharge with short course of analgesics.  Recommend outpatient follow with PCP to evaluate for possible radiculopathy as a cause    ____________________________________________   FINAL CLINICAL IMPRESSION(S) / ED DIAGNOSES  Final diagnoses:  Acute pain of right shoulder        Note:  This document was prepared using Dragon voice recognition software and may include unintentional dictation errors.   2022, MD 06/15/20 1121

## 2020-08-09 IMAGING — CR DG SHOULDER 2+V*R*
1 series · 3 of 3 positions shown · non-contrast
Comparison: None

CLINICAL DATA: RIGHT shoulder can pain, felt a pop while in the
shower this morning, limited range of motion, increased pain with
movement

EXAM:
RIGHT SHOULDER - 2+ VIEW

[Series 1: dg shoulder right · 0.14mm/px · 3 of 3 slices shown]
[im 1/3]
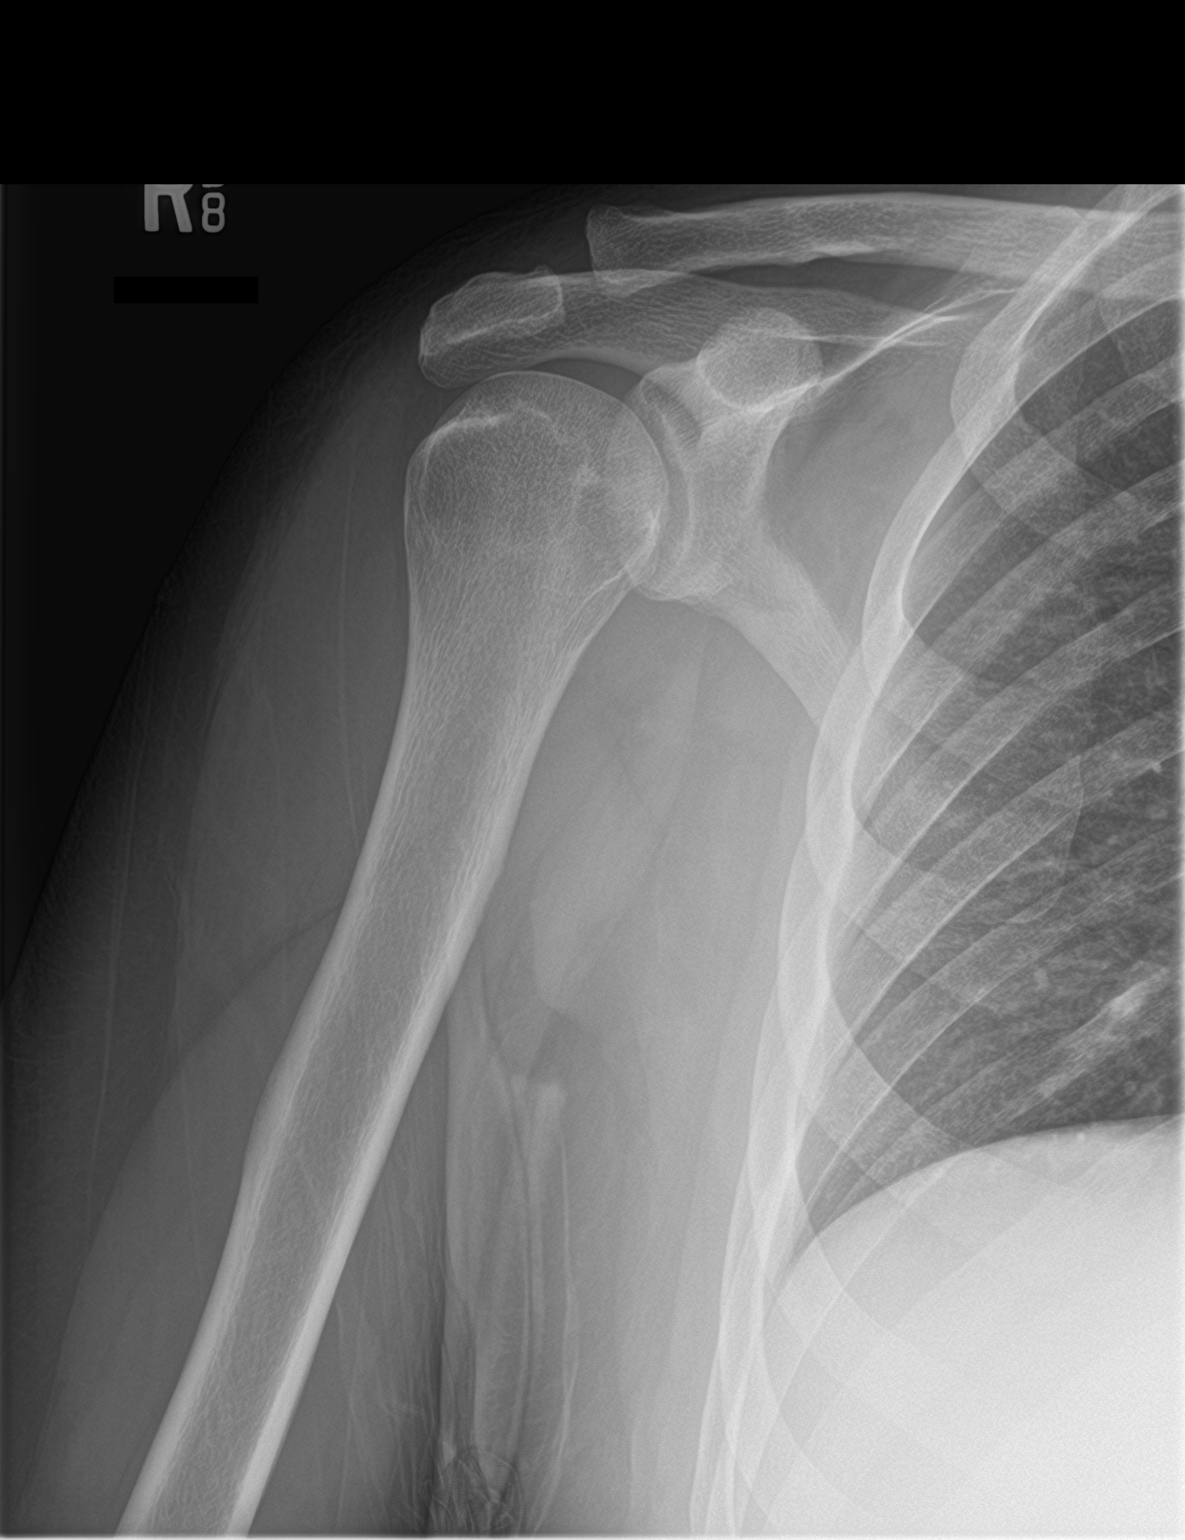
[im 2/3]
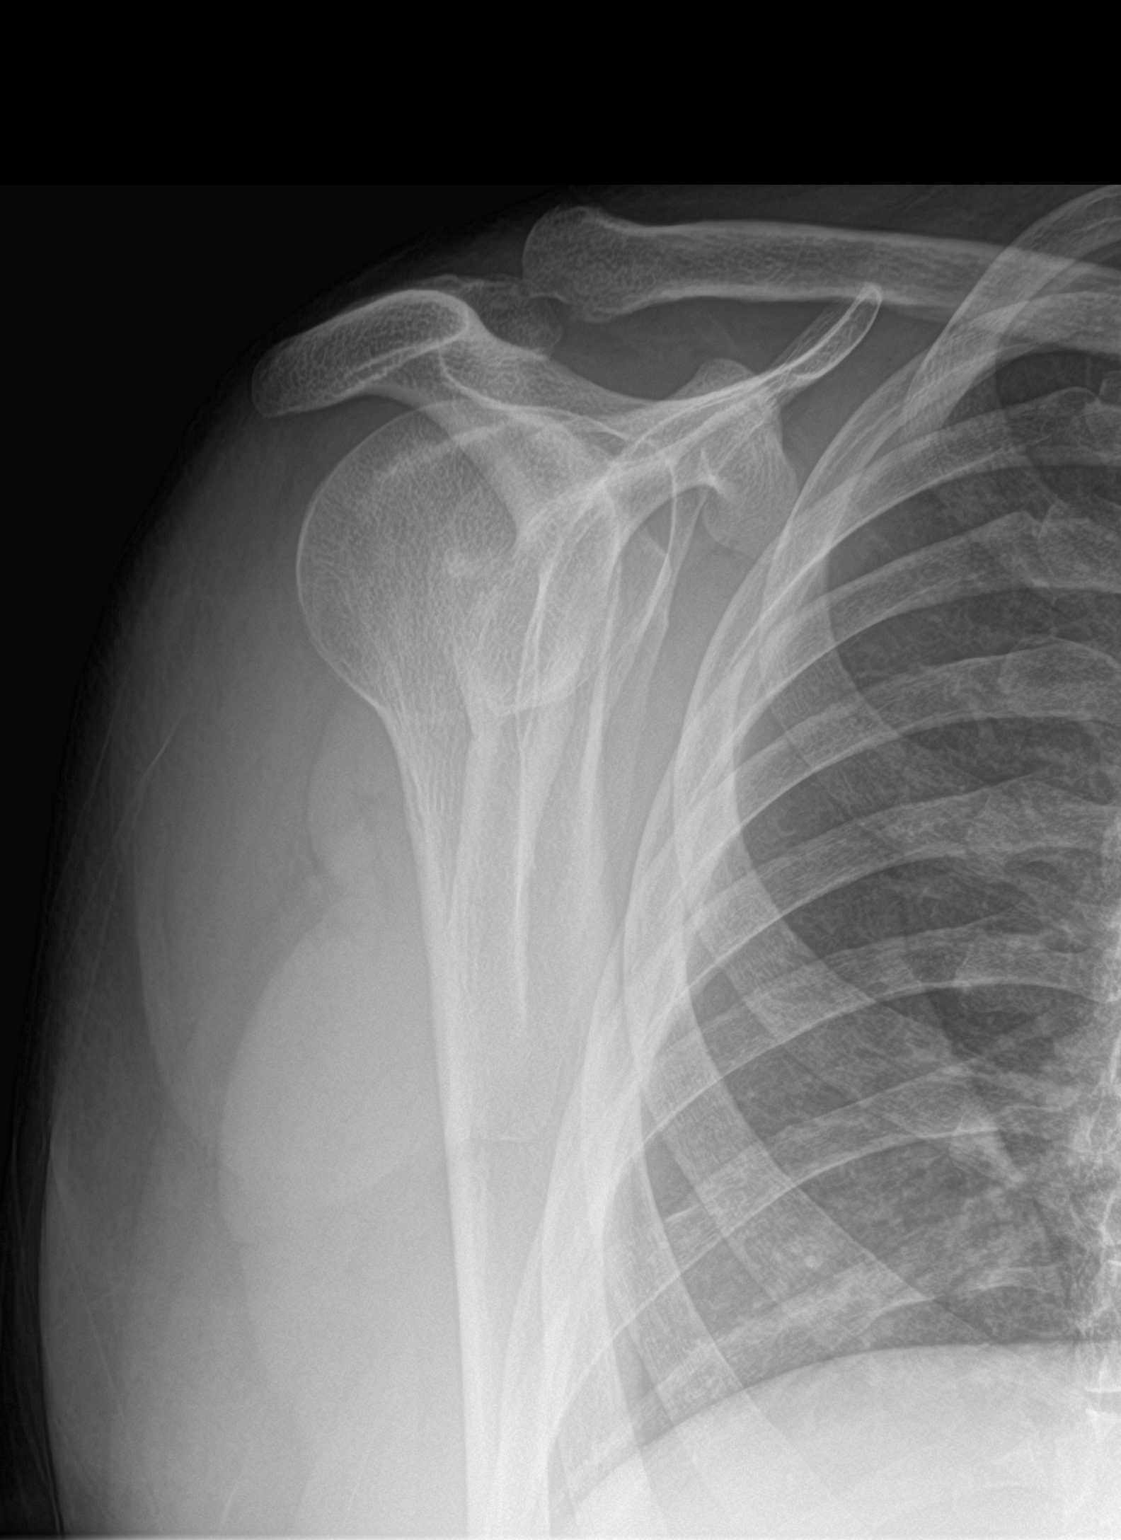
[im 3/3]
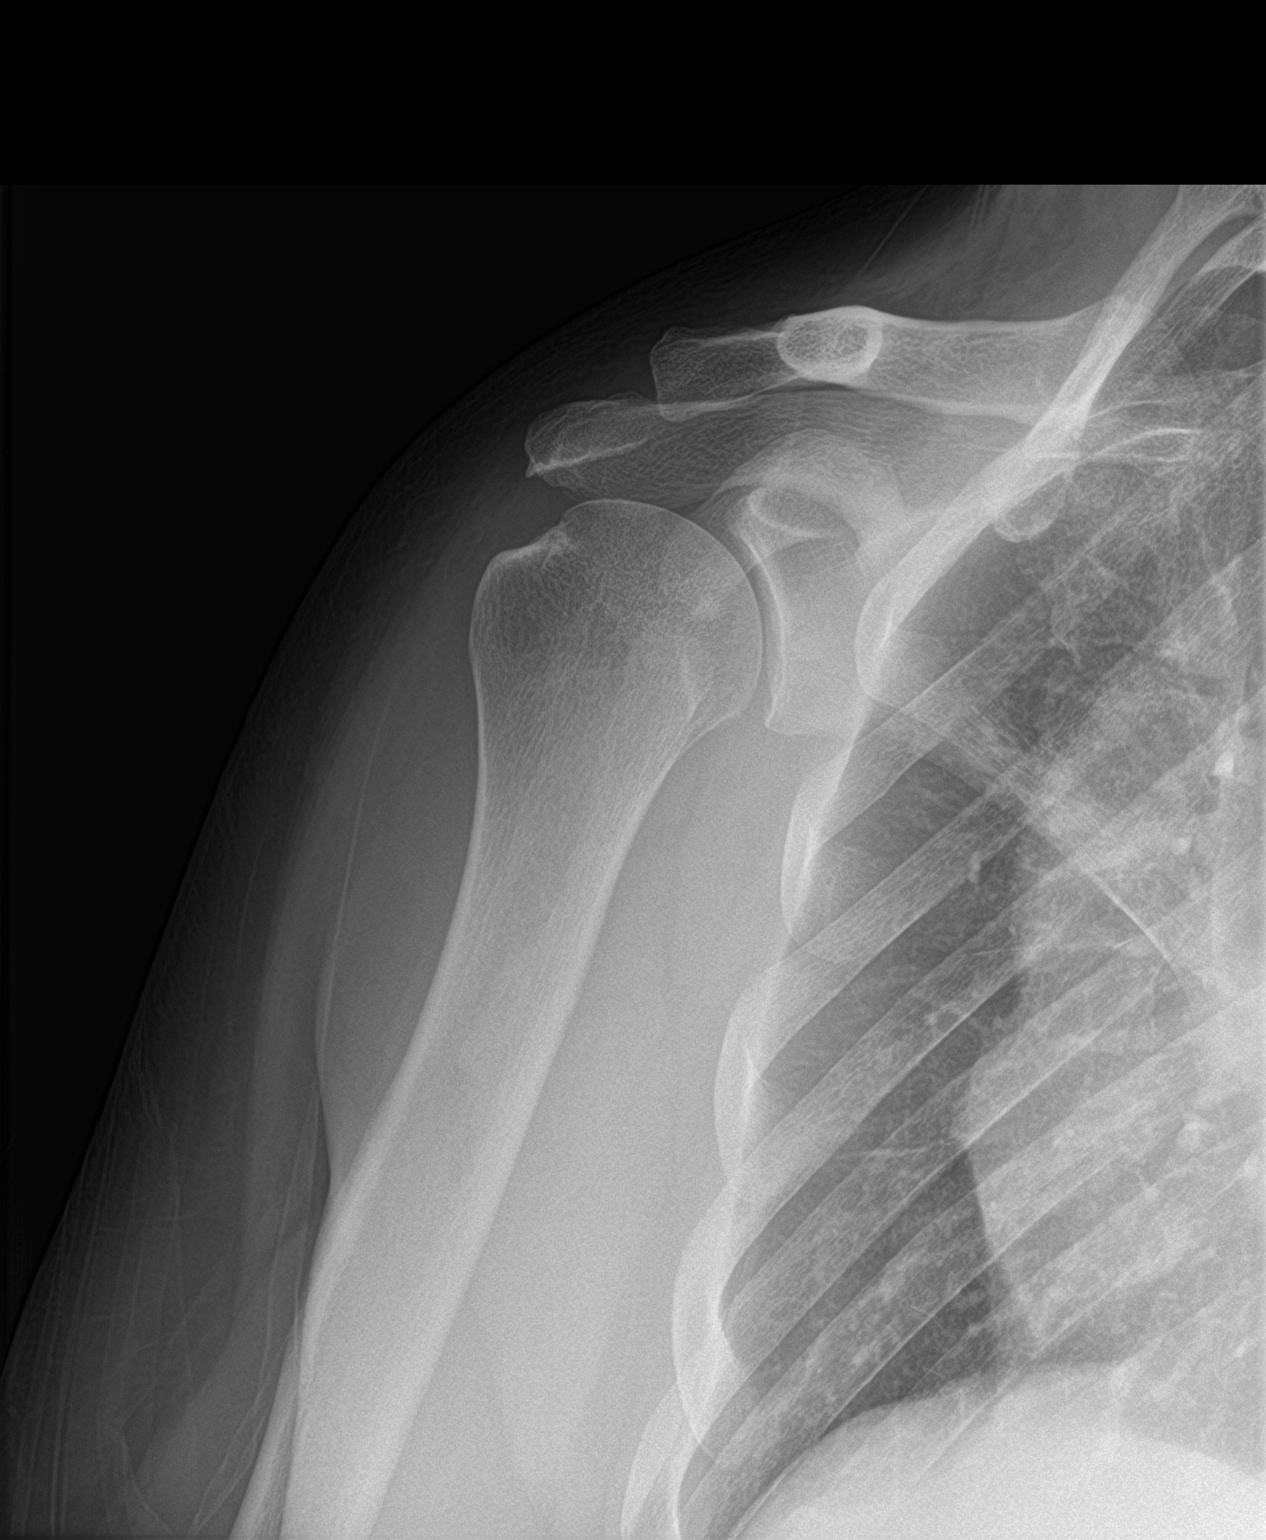

[3 of 3 positions shown; findings below may reference images not displayed]

FINDINGS: Upper normal diameter of the AC joint.

AC joint alignment otherwise normal.

Osseous mineralization normal.

No acute fracture, dislocation, or bone destruction.

Visualized RIGHT ribs unremarkable.
IMPRESSION: No acute osseous abnormalities.

## 2020-10-21 ENCOUNTER — Emergency Department
Admission: EM | Admit: 2020-10-21 | Discharge: 2020-10-21 | Disposition: A | Discharge status: Home / Self Care | Payer: Managed Care, Other (non HMO) | Attending: Emergency Medicine | Admitting: Emergency Medicine

## 2020-10-21 ENCOUNTER — Emergency Department: Payer: Managed Care, Other (non HMO)

## 2020-10-21 ENCOUNTER — Other Ambulatory Visit: Payer: Self-pay

## 2020-10-21 DIAGNOSIS — E1122 Type 2 diabetes mellitus with diabetic chronic kidney disease: Secondary | ICD-10-CM | POA: Insufficient documentation

## 2020-10-21 DIAGNOSIS — M25511 Pain in right shoulder: Secondary | ICD-10-CM | POA: Insufficient documentation

## 2020-10-21 DIAGNOSIS — Z7984 Long term (current) use of oral hypoglycemic drugs: Secondary | ICD-10-CM | POA: Insufficient documentation

## 2020-10-21 DIAGNOSIS — Z7982 Long term (current) use of aspirin: Secondary | ICD-10-CM | POA: Insufficient documentation

## 2020-10-21 DIAGNOSIS — G8929 Other chronic pain: Secondary | ICD-10-CM

## 2020-10-21 DIAGNOSIS — Z79899 Other long term (current) drug therapy: Secondary | ICD-10-CM | POA: Insufficient documentation

## 2020-10-21 DIAGNOSIS — Z940 Kidney transplant status: Secondary | ICD-10-CM | POA: Insufficient documentation

## 2020-10-21 DIAGNOSIS — I129 Hypertensive chronic kidney disease with stage 1 through stage 4 chronic kidney disease, or unspecified chronic kidney disease: Secondary | ICD-10-CM | POA: Insufficient documentation

## 2020-10-21 DIAGNOSIS — N189 Chronic kidney disease, unspecified: Secondary | ICD-10-CM | POA: Insufficient documentation

## 2020-10-21 MED ORDER — OXYCODONE-ACETAMINOPHEN 5-325 MG PO TABS
2.0000 | ORAL_TABLET | Freq: Once | ORAL | Status: AC
Start: 2020-10-21 — End: 2020-10-21
  Administered 2020-10-21: 2 via ORAL
  Filled 2020-10-21: qty 2

## 2020-10-21 MED ORDER — DEXAMETHASONE SODIUM PHOSPHATE 10 MG/ML IJ SOLN
10.0000 mg | Freq: Once | INTRAMUSCULAR | Status: AC
  Administered 2020-10-21: 10 mg via INTRAMUSCULAR
  Filled 2020-10-21: qty 1

## 2020-10-21 MED ORDER — OXYCODONE-ACETAMINOPHEN 10-325 MG PO TABS: 1.0000 | ORAL_TABLET | Freq: Four times a day (QID) | ORAL | 0 refills | Status: DC | PRN

## 2020-10-21 MED ORDER — IBUPROFEN 800 MG PO TABS: 800.0000 mg | ORAL_TABLET | Freq: Once | ORAL | Status: DC

## 2020-10-21 MED ORDER — FENTANYL CITRATE (PF) 100 MCG/2ML IJ SOLN
50.0000 ug | Freq: Once | INTRAMUSCULAR | Status: AC
  Administered 2020-10-21: 50 ug via INTRAMUSCULAR
  Filled 2020-10-21: qty 2

## 2020-10-21 NOTE — Discharge Instructions (Signed)

## 2020-10-21 NOTE — ED Notes (Signed)
Patient instructed not to drive. Patient verbalized he understood and has a ride

## 2020-10-21 NOTE — ED Triage Notes (Addendum)
Chronic right shoulder pain that intermittently worsens since shoulder surgery in 2019. Reports always in some type of pain to right shoulder but has "flare ups" when pain is uncontrollable. Increase of pain since Wednesday. Pulses, sensation and color WNL to right arm and hand. Denies numbness or tingling to arm. Pt alert and oriented X4, cooperative, RR even and unlabored, color WNL. Pt in NAD.

## 2020-10-21 NOTE — ED Provider Notes (Signed)
[facility] Emergency Department Provider Note  ____________________________________________   Event Date/Time   First MD Initiated Contact with Patient 10/21/20 [phone]     (approximate)  I have reviewed the triage vital signs and the nursing notes.   HISTORY  Chief Complaint Shoulder Pain    HPI DAE HIGHLEY is a 45 y.o. male right-hand-dominant male with history of kidney transplant, diabetes who presents to the emergency department with complaints of right shoulder pain.  Patient has had intermittent right shoulder pain since shoulder replacement in 2019 at [facility].  States that he will occasionally have flareups of pain without any repeat injury.  He is being followed as an outpatient and has been seen by pain management.  He reports when he has previously come to the ED he received a Decadron injection and was discharged with Percocet 10 mg tablets which helped his pain.  He states he is waiting to get an MRI of his neck to rule out radiculopathy.  He has had an MRI of his shoulder.  He denies any radiation of pain.  No numbness or weakness.  Patient works as an Technical brewer.        Past Medical History:  Diagnosis Date  . Alport syndrome   . Anemia of chronic kidney failure   . Bacteremia   . Chronic rejection of kidney transplant   . Diabetes mellitus without complication (HCC)   . Effusion of hand joint   . Gout   . H/O Clostridium difficile infection   . 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 Hypertension   . Respiratory failure [facility])     Patient Active Problem List   Diagnosis Date Noted  . Intractable abdominal pain 05/26/2016  . Scapular dysfunction 02/07/2015  . Aftercare following organ transplant 01/03/2014  . H/O kidney transplant 01/03/2014  . Essential (primary) hypertension 08/16/2013  . 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 Synovitis or tenosynovitis of wrist 09/27/2012  .  Arthritis 09/19/2012  . Gout 09/19/2012  . Effusion of hand joint 09/19/2012  . Absolute anemia 08/15/2012  . Anemia, iron deficiency 05/13/2012  . BP (high blood pressure) 01/26/2012  . DM (diabetes mellitus), secondary (HCC) 01/26/2012  . Chronic rejection of kidney transplant 01/04/2011    Past Surgical History:  Procedure Laterality Date  . KIDNEY TRANSPLANT    . NEPHRECTOMY TRANSPLANTED ORGAN      Prior to Admission medications   Medication Sig Start Date End Date Taking? Authorizing Provider  oxyCODONE-acetaminophen (PERCOCET) 10-325 MG tablet Take 1 tablet by mouth every 6 (six) hours as needed for pain. 10/21/20 10/21/21 Yes Emelynn Rance, Layla Maw, DO  allopurinol (ZYLOPRIM) 100 MG tablet Take 100 mg by mouth. 04/20/14   [provider]  aspirin EC 81 MG tablet Take 81 mg by mouth.    [provider]  cephALEXin (KEFLEX) 500 MG capsule Take 1 capsule (500 mg total) by mouth 3 (three) times daily. 04/29/20   Becky Augusta, NP  famotidine (PEPCID) 20 MG tablet Take 20 mg by mouth 2 (two) times daily.    [provider]  glipiZIDE (GLUCOTROL) 5 MG tablet Take 5 mg by mouth daily before breakfast.    [provider]  Multiple Vitamin (MULTIVITAMIN) capsule Take by mouth.    [provider]  mycophenolate (CELLCEPT) 250 MG capsule Take 2 tablets (500 mg) every 12 hours. Dx code: z94.0 12/10/14   [provider]  tacrolimus (PROGRAF) 1 MG capsule Take 4 (1 mg) capsules every 12 hours. DAW1. Brand name medically necessary. Dx code: Z31.0. TX date: 5.12.2015 12/18/14   [provider]  traZODone (DESYREL) 50 MG tablet Take 50 mg by mouth. 12/15/13   [provider]  ranitidine (ZANTAC) 150 MG capsule Take 150 mg by mouth. 07/04/14 04/29/20  [provider]    Allergies Penicillins, Myfortic [mycophenolate mofetil], and Oxycodone  Family History  Problem Relation Age of Onset  . Alport syndrome Mother   . Kidney  failure Mother   . Diabetes Father   . Hypertension Father     Social History Social History   Tobacco Use  . Smoking status: Never Smoker  . Smokeless tobacco: Never Used  Vaping Use  . Vaping Use: Never used  Substance Use Topics  . Alcohol use: No    Alcohol/week: 0.0 standard drinks  . Drug use: Not Currently    Frequency: 3.0 times per week    Review of Systems Constitutional: No fever. Eyes: No visual changes. ENT: No sore throat. Cardiovascular: Denies chest pain. Respiratory: Denies shortness of breath. Gastrointestinal: No nausea, vomiting, diarrhea. Genitourinary: Negative for dysuria. Musculoskeletal: Negative for back pain. Skin: Negative for rash. Neurological: Negative for focal weakness or numbness.  ____________________________________________   PHYSICAL EXAM:  VITAL SIGNS: ED Triage Vitals [10/21/20 0544]  Enc Vitals Group     BP (!) 153/112     Pulse Rate 90     Resp 16     Temp 97.6 F (36.4 C)     Temp Source Oral     SpO2 99 %     Weight 228 lb (103.4 kg)     Height 5\' 9"  (1.753 m)     Head Circumference      Peak Flow      Pain Score 8     Pain Loc      Pain Edu?      Excl. in GC?    CONSTITUTIONAL: Alert and responds appropriately to questions. Well-appearing; well-nourished HEAD: Normocephalic, atraumatic EYES: Conjunctivae clear, pupils appear equal ENT: normal nose; moist mucous membranes NECK: Normal range of motion, no midline spinal tenderness or step-off or deformity CARD: Regular rate and rhythm RESP: Normal chest excursion without splinting or tachypnea; no hypoxia or respiratory distress, speaking full sentences ABD/GI: non-distended EXT: Patient has pain to palpation over the right shoulder diffusely and tender over the biceps tendon insertion.  There is no redness, warmth or obvious joint effusion.  He has full range of motion in the right shoulder but does have pain with internal and external rotation and flexion with  abduction.  2+ right radial pulse.  Normal sensation throughout the right upper extremity.  Compartments soft.  No redness or warmth.  Normal capillary refill. SKIN: Normal color for age and race, no rashes on exposed skin NEURO: Moves all extremities equally, normal speech, no facial asymmetry noted, normal sensation in the right upper extremity, normal right grip strength PSYCH: The patient's mood and manner are appropriate. Grooming and personal hygiene are appropriate.  ____________________________________________   LABS (all labs ordered are listed, but only abnormal results are displayed)  Labs Reviewed - No data to display ____________________________________________  EKG  ____________________________________________  RADIOLOGY I, Deloras Reichard, personally viewed and evaluated these images (plain radiographs) as part of my medical decision making, as well as reviewing the written report by the radiologist.  ED MD interpretation:    Official radiology report(s): No results  found.  ____________________________________________   PROCEDURES  Procedure(s) performed (including Critical Care):  Procedures  CRITICAL CARE Performed by: Baxter Hire Anahid Eskelson    ____________________________________________   INITIAL IMPRESSION / ASSESSMENT AND PLAN / ED COURSE  As part of my medical decision making, I reviewed the following data within the electronic MEDICAL RECORD NUMBER Nursing notes reviewed and incorporated, Old chart reviewed, Notes from prior ED visits and [place] Controlled Substance Database         Patient here with complaints of right shoulder pain.  Has had intermittent pain on and off since shoulder replacement in 2019.  No new injury.  Have offered an x-ray today which he declines.  Neurovascular intact distally.  Requesting Decadron injection as this has helped him before and 10 mg Percocet tablets.  He has outpatient follow-up scheduled.  No signs of compartment syndrome,  gout, septic arthritis, cellulitis on exam.  Low suspicion for fracture.  Does not seem to be radicular in nature.  He has no neck tenderness.  At this time, I do not feel there is any life-threatening condition present. I have reviewed, interpreted and discussed all results (EKG, imaging, lab, urine as appropriate) and exam findings with patient/family. I have reviewed nursing notes and appropriate previous records.  I feel the patient is safe to be discharged home without further emergent workup and can continue workup as an outpatient as needed. Discussed usual and customary return precautions. Patient/family verbalize understanding and are comfortable with this plan.  Outpatient follow-up has been provided as needed. All questions have been answered.  ____________________________________________   FINAL CLINICAL IMPRESSION(S) / ED DIAGNOSES  Final diagnoses:  Chronic right shoulder pain     ED Discharge Orders         Ordered    oxyCODONE-acetaminophen (PERCOCET) 10-325 MG tablet  Every 6 hours PRN        10/21/20 0627          *Please note:  MARKEVION LATTIN was evaluated in Emergency Department on 10/21/2020 for the symptoms described in the history of present illness. He was evaluated in the context of the global COVID-19 pandemic, which necessitated consideration that the patient might be at risk for infection with the SARS-CoV-2 virus that causes COVID-19. Institutional protocols and algorithms that pertain to the evaluation of patients at risk for COVID-19 are in a state of rapid change based on information released by regulatory bodies including the CDC and federal and state organizations. These policies and algorithms were followed during the patient's care in the ED.  Some ED evaluations and interventions may be delayed as a result of limited staffing during and the pandemic.*   Note:  This document was prepared using Dragon voice recognition software and may include unintentional  dictation errors.   Sayvon Arterberry, Layla Maw, DO 10/21/20 [phone]

## 2021-02-22 ENCOUNTER — Emergency Department
Admission: EM | Admit: 2021-02-22 | Discharge: 2021-02-22 | Disposition: A | Discharge status: Home / Self Care | Payer: Managed Care, Other (non HMO) | Attending: Emergency Medicine | Admitting: Emergency Medicine

## 2021-02-22 ENCOUNTER — Other Ambulatory Visit: Payer: Self-pay

## 2021-02-22 DIAGNOSIS — Z7982 Long term (current) use of aspirin: Secondary | ICD-10-CM | POA: Insufficient documentation

## 2021-02-22 DIAGNOSIS — N189 Chronic kidney disease, unspecified: Secondary | ICD-10-CM | POA: Diagnosis not present

## 2021-02-22 DIAGNOSIS — M25511 Pain in right shoulder: Secondary | ICD-10-CM | POA: Insufficient documentation

## 2021-02-22 DIAGNOSIS — D631 Anemia in chronic kidney disease: Secondary | ICD-10-CM | POA: Insufficient documentation

## 2021-02-22 DIAGNOSIS — Z7984 Long term (current) use of oral hypoglycemic drugs: Secondary | ICD-10-CM | POA: Diagnosis not present

## 2021-02-22 DIAGNOSIS — E119 Type 2 diabetes mellitus without complications: Secondary | ICD-10-CM | POA: Diagnosis not present

## 2021-02-22 DIAGNOSIS — I129 Hypertensive chronic kidney disease with stage 1 through stage 4 chronic kidney disease, or unspecified chronic kidney disease: Secondary | ICD-10-CM | POA: Insufficient documentation

## 2021-02-22 MED ORDER — FENTANYL CITRATE (PF) 100 MCG/2ML IJ SOLN
25.0000 ug | Freq: Once | INTRAMUSCULAR | Status: AC
  Administered 2021-02-22: 25 ug via INTRAMUSCULAR
  Filled 2021-02-22: qty 2

## 2021-02-22 MED ORDER — DEXAMETHASONE SODIUM PHOSPHATE 10 MG/ML IJ SOLN
10.0000 mg | Freq: Once | INTRAMUSCULAR | Status: AC
  Administered 2021-02-22: 10 mg via INTRAMUSCULAR
  Filled 2021-02-22: qty 1

## 2021-02-22 MED ORDER — METHYLPREDNISOLONE 4 MG PO TBPK: ORAL_TABLET | ORAL | 0 refills | Status: AC

## 2021-02-22 MED ORDER — OXYCODONE HCL 5 MG PO TABS: 5.0000 mg | ORAL_TABLET | Freq: Three times a day (TID) | ORAL | 0 refills | Status: AC | PRN

## 2021-02-22 NOTE — ED Triage Notes (Signed)
Pt comes pov with chronic right shoulder pain. Has flair ups without injury. Has had rotator cuff surgery. Attempting to get MRI but insurance causing issues with scheduling. Pain started back again Thursday and is getting worse where he can't work. States PCP hasn't been in the office to schedule an appt.

## 2021-02-22 NOTE — ED Notes (Signed)
Pt with history of right shoulder surgery and ongoing pain. Pt states pain has increased in the last few days and his primary care MD is out of the office.

## 2021-02-22 NOTE — ED Provider Notes (Signed)
[facility] Emergency Department Provider Note  ____________________________________________   Event Date/Time   First MD Initiated Contact with Patient 02/22/21 [phone]     (approximate)  I have reviewed the triage vital signs and the nursing notes.   HISTORY  Chief Complaint Shoulder Pain    HPI Francisco Ruiz is a 45 y.o. male presents emergency department with continued right shoulder pain.  Patient had a history of a rotator cuff tear and had surgery.  He is continue to have pain.  Last MRI showed a bicep tendon tear and tendinosis along the scapula.  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  States that its been hurting very badly.  History of a kidney transplant so cannot take NSAIDs.  States the last time he got a Decadron shot it helped greatly.  No numbness or tingling.  No fever or chills  Past Medical History:  Diagnosis Date   Alport syndrome    Anemia of chronic kidney failure    Bacteremia    Chronic rejection of kidney transplant    Diabetes mellitus without complication (HCC)    Effusion of hand joint    Gout    H/O Clostridium difficile infection    H/O kidney transplant    in 1994, then again in 2004 secondary to underlying Alport  syndrome   Hypertension    Respiratory failure (HCC)     Patient Active Problem List   Diagnosis Date Noted   Intractable abdominal pain 05/26/2016   Scapular dysfunction 02/07/2015   Aftercare following organ transplant 01/03/2014   H/O kidney transplant 01/03/2014   Essential (primary) hypertension 08/16/2013   Alport syndrome 02/15/2013   Synovitis or tenosynovitis of wrist 09/27/2012   Arthritis 09/19/2012   Gout 09/19/2012   Effusion of hand joint 09/19/2012   Absolute anemia 08/15/2012   Anemia, iron deficiency 05/13/2012   BP (high blood pressure) 01/26/2012   DM (diabetes mellitus), secondary (HCC) 01/26/2012   Chronic rejection of kidney transplant 01/04/2011    Past Surgical  History:  Procedure Laterality Date   KIDNEY TRANSPLANT     NEPHRECTOMY TRANSPLANTED ORGAN      Prior to Admission medications   Medication Sig Start Date End Date Taking? Authorizing Provider  methylPREDNISolone (MEDROL DOSEPAK) 4 MG TBPK tablet Take 6 pills on day one then decrease by 1 pill each day 02/22/21  Yes Medea Deines, Roselyn Bering, PA-C  oxyCODONE (ROXICODONE) 5 MG immediate release tablet Take 1 tablet (5 mg total) by mouth every 8 (eight) hours as needed. 02/22/21 02/22/22 Yes Naleigha Raimondi, Roselyn Bering, PA-C  allopurinol (ZYLOPRIM) 100 MG tablet Take 100 mg by mouth. 04/20/14   [provider]  aspirin EC 81 MG tablet Take 81 mg by mouth.    [provider]  cephALEXin (KEFLEX) 500 MG capsule Take 1 capsule (500 mg total) by mouth 3 (three) times daily. 04/29/20   Becky Augusta, NP  famotidine (PEPCID) 20 MG tablet Take 20 mg by mouth 2 (two) times daily.    [provider]  glipiZIDE (GLUCOTROL) 5 MG tablet Take 5 mg by mouth daily before breakfast.    [provider]  Multiple Vitamin (MULTIVITAMIN) capsule Take by mouth.    [provider]  mycophenolate (CELLCEPT) 250 MG capsule Take 2 tablets (500 mg) every 12 hours. Dx code: z94.0 12/10/14   [provider]  tacrolimus (PROGRAF) 1 MG capsule Take 4 (1 mg) capsules every 12 hours. DAW1. Brand name medically necessary.  Dx code: Z26.0. TX date: 5.12.2015 12/18/14   [provider]  traZODone (DESYREL) 50 MG tablet Take 50 mg by mouth. 12/15/13   [provider]  ranitidine (ZANTAC) 150 MG capsule Take 150 mg by mouth. 07/04/14 04/29/20  [provider]    Allergies Penicillins, Myfortic [mycophenolate mofetil], and Oxycodone  Family History  Problem Relation Age of Onset   Alport syndrome Mother    Kidney failure Mother    Diabetes Father    Hypertension Father     Social History Social History   Tobacco Use   Smoking status: Never   Smokeless tobacco: Never   Vaping Use   Vaping Use: Never used  Substance Use Topics   Alcohol use: No    Alcohol/week: 0.0 standard drinks   Drug use: Not Currently    Frequency: 3.0 times per week    Review of Systems  Constitutional: No fever/chills Eyes: No visual changes. ENT: No sore throat. Respiratory: Denies cough Cardiovascular: Denies chest pain Gastrointestinal: Denies abdominal pain Genitourinary: Negative for dysuria. Musculoskeletal: Negative for back pain.  Positive for right shoulder pain Skin: Negative for rash. Psychiatric: no mood changes,     ____________________________________________   PHYSICAL EXAM:  VITAL SIGNS: ED Triage Vitals  Enc Vitals Group     BP 02/22/21 0712 (!) 138/106     Pulse Rate 02/22/21 0712 74     Resp 02/22/21 0712 20     Temp 02/22/21 0712 97.8 F (36.6 C)     Temp Source 02/22/21 0712 Oral     SpO2 02/22/21 0712 97 %     Weight 02/22/21 0712 228 lb (103.4 kg)     Height 02/22/21 0712 5\' 9"  (1.753 m)     Head Circumference --      Peak Flow --      Pain Score 02/22/21 0719 8     Pain Loc --      Pain Edu? --      Excl. in GC? --     Constitutional: Alert and oriented. Well appearing and in no acute distress. Eyes: Conjunctivae are normal.  Head: Atraumatic. Nose: No congestion/rhinnorhea. Mouth/Throat: Mucous membranes are moist.   Neck:  supple no lymphadenopathy noted Cardiovascular: Normal rate, regular rhythm.  Respiratory: Normal respiratory effort.  No retractions GU: deferred Musculoskeletal: FROM all extremities, warm and well perfused, full range of motion of the right shoulder, area is tender posteriorly, neurovascular Neurologic:  Normal speech and language.  Skin:  Skin is warm, dry and intact. No rash noted. Psychiatric: Mood and affect are normal. Speech and behavior are normal.  ____________________________________________   LABS (all labs ordered are listed, but only abnormal results are displayed)  Labs Reviewed  - No data to display ____________________________________________   ____________________________________________  RADIOLOGY    ____________________________________________   PROCEDURES  Procedure(s) performed: No  Procedures    ____________________________________________   INITIAL IMPRESSION / ASSESSMENT AND PLAN / ED COURSE  Pertinent labs & imaging results that were available during my care of the patient were reviewed by me and considered in my medical decision making (see chart for details).   Patient is 45 year old male presents emergency department with shoulder pain.  See HPI.  Physical exam shows patient per stable.  Due to the patient being unable to take NSAIDs did do a Decadron shot, he was given fentanyl 25 mcg IM.  He is to follow-up with his regular doctor.  Prescriptions for outpatient treatment were given.  Follow-up with  orthopedics.  Apply ice.  Return if worsening.     Francisco Ruiz was evaluated in Emergency Department on 02/22/2021 for the symptoms described in the history of present illness. He was evaluated in the context of the global COVID-19 pandemic, which necessitated consideration that the patient might be at risk for infection with the SARS-CoV-2 virus that causes COVID-19. Institutional protocols and algorithms that pertain to the evaluation of patients at risk for COVID-19 are in a state of rapid change based on information released by regulatory bodies including the CDC and federal and state organizations. These policies and algorithms were followed during the patient's care in the ED.    As part of my medical decision making, I reviewed the following data within the electronic MEDICAL RECORD NUMBER Nursing notes reviewed and incorporated, Old chart reviewed, Notes from prior ED visits, and [place] Controlled Substance Database  ____________________________________________   FINAL CLINICAL IMPRESSION(S) / ED DIAGNOSES  Final diagnoses:  Acute pain of  right shoulder      NEW MEDICATIONS STARTED DURING THIS VISIT:  Discharge Medication List as of 02/22/2021  8:43 AM     START taking these medications   Details  methylPREDNISolone (MEDROL DOSEPAK) 4 MG TBPK tablet Take 6 pills on day one then decrease by 1 pill each day, Normal    oxyCODONE (ROXICODONE) 5 MG immediate release tablet Take 1 tablet (5 mg total) by mouth every 8 (eight) hours as needed., Starting Sat 02/22/2021, Until Sun 02/22/2022 at 2359, Normal         Note:  This document was prepared using Dragon voice recognition software and may include unintentional dictation errors.    Faythe Ghee, PA-C 02/22/21 1133    Jene Every, MD 02/22/21 [phone]

## 2021-10-20 IMAGING — CR DG SHOULDER 2+V*R*
1 series · 4 of 4 positions shown · non-contrast
Comparison: 05/24/2018

CLINICAL DATA: Right shoulder pain and decreased range of motion.
No known injury.

EXAM:
RIGHT SHOULDER - 2+ VIEW

[Series 1: dg shoulder right · 0.14mm/px · 4 of 4 slices shown]
[im 1/4]
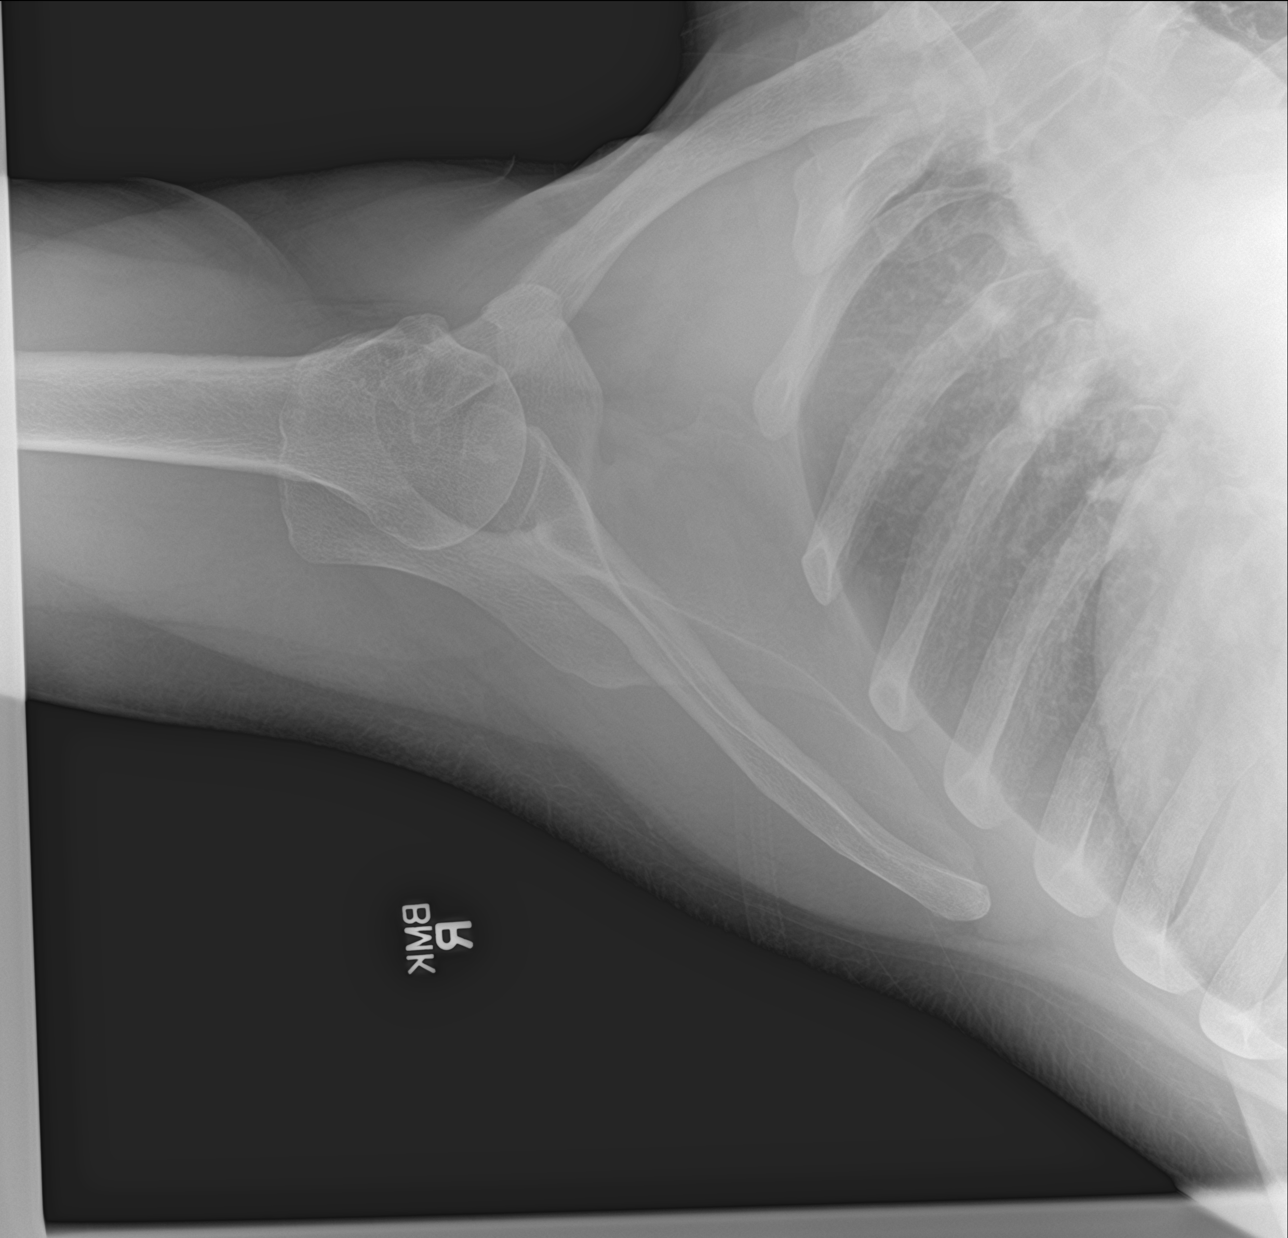
[im 2/4]
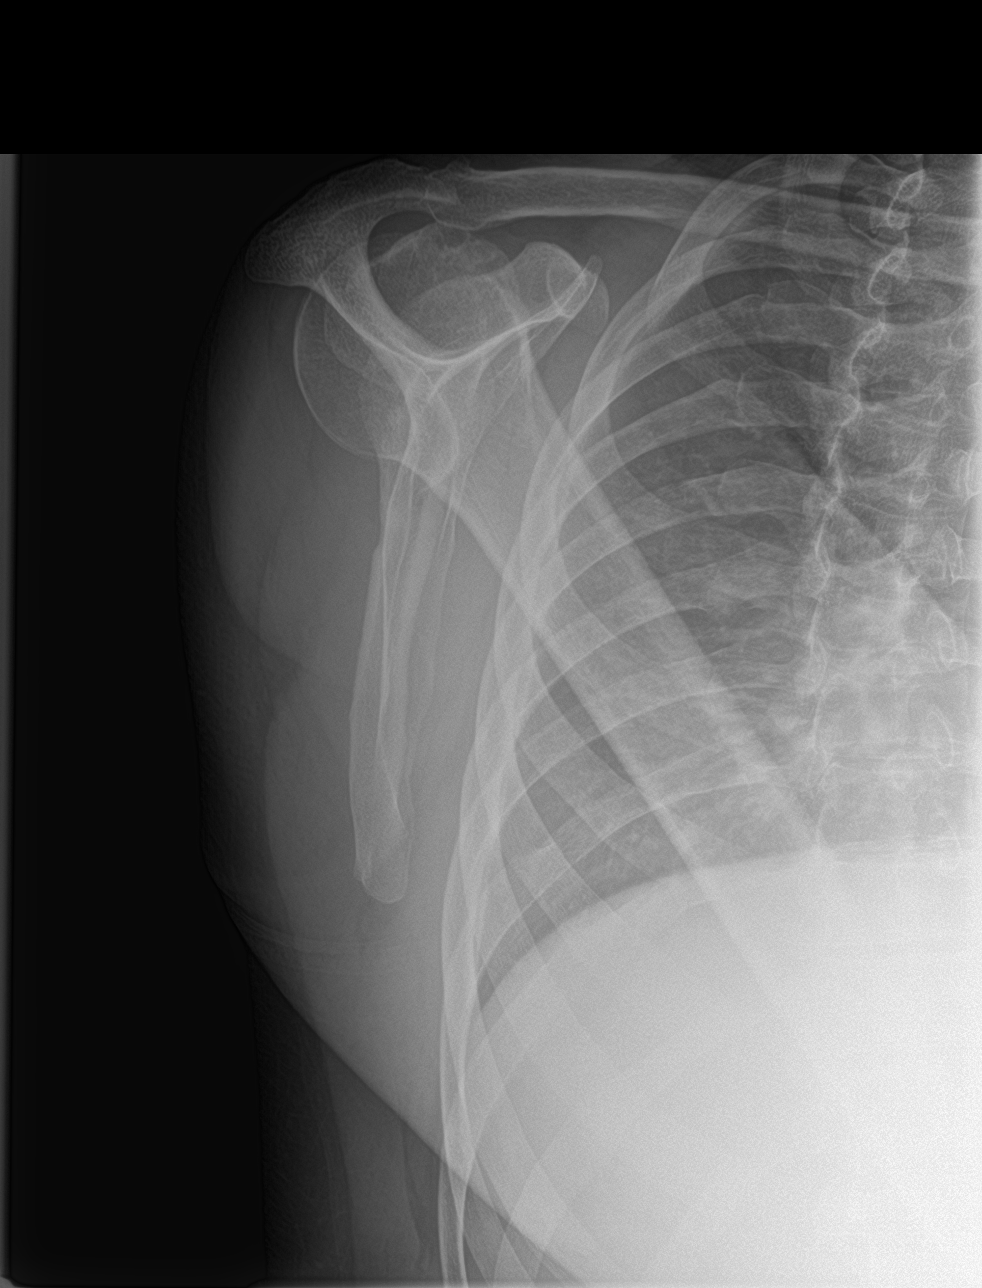
[im 3/4]
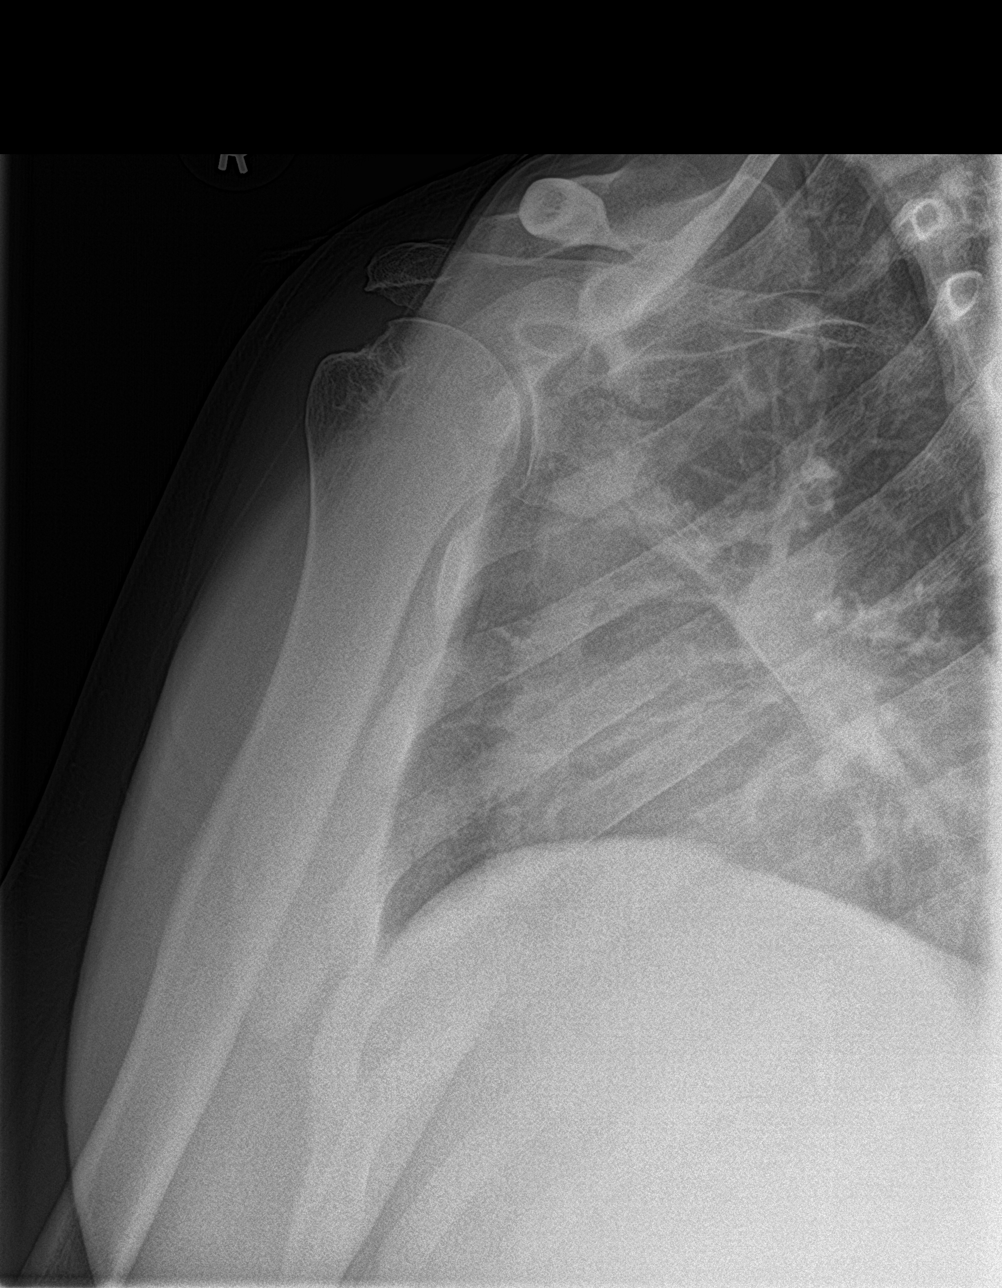
[im 4/4]
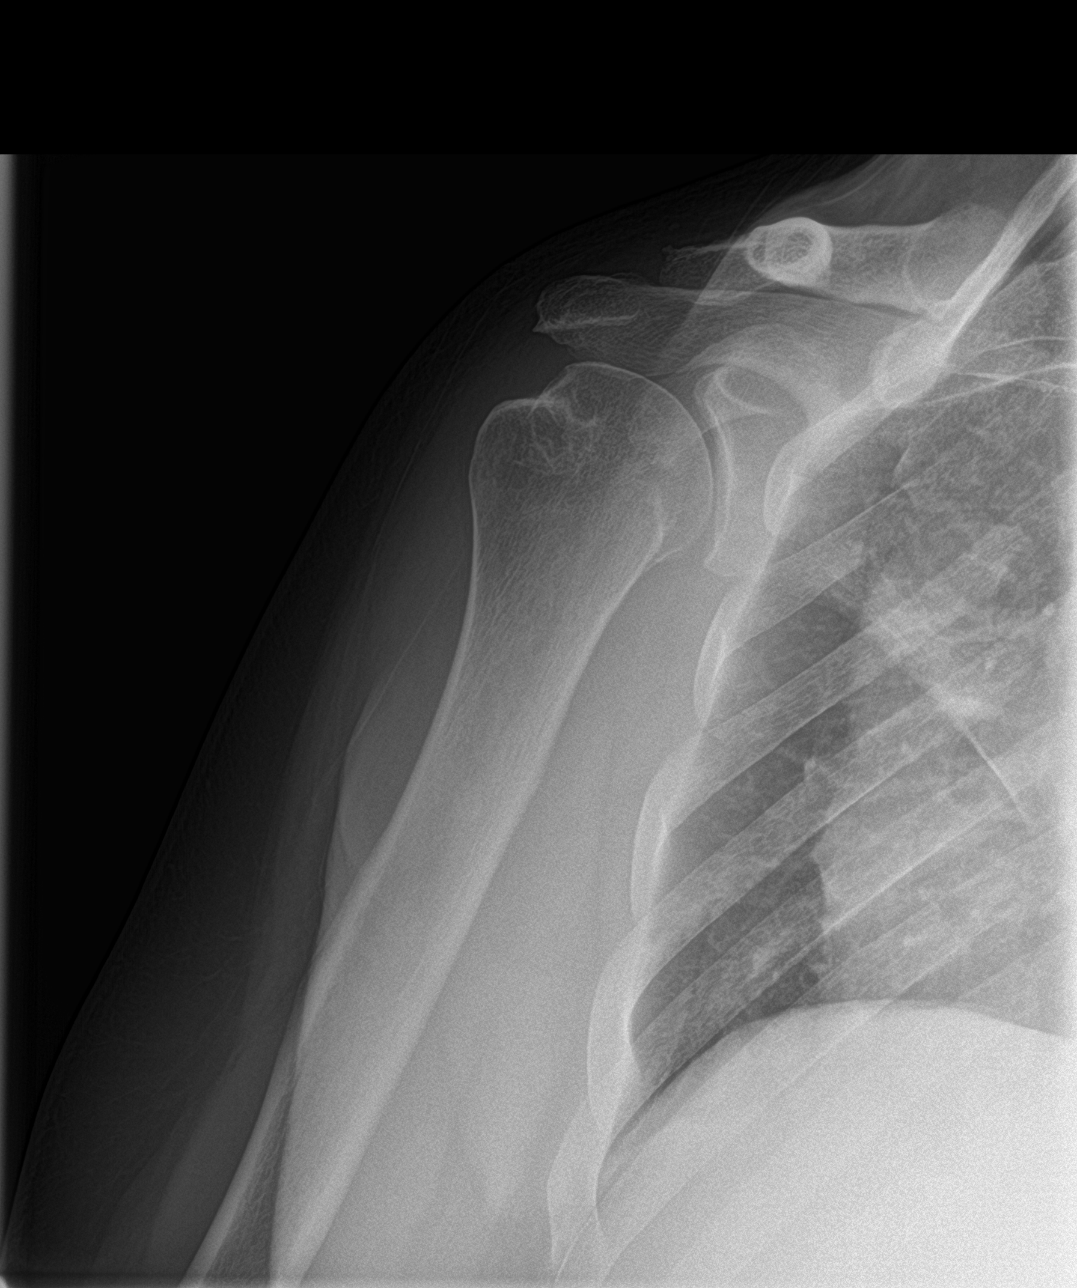

[4 of 4 positions shown; findings below may reference images not displayed]

FINDINGS: There is no evidence of fracture or dislocation. There is no
evidence of arthropathy or other focal bone abnormality. Soft
tissues are unremarkable.
IMPRESSION: Negative.

## 2023-02-03 DIAGNOSIS — E118 Type 2 diabetes mellitus with unspecified complications: Secondary | ICD-10-CM | POA: Diagnosis not present

## 2023-09-15 DIAGNOSIS — G8929 Other chronic pain: Secondary | ICD-10-CM | POA: Diagnosis not present

## 2023-09-15 DIAGNOSIS — M25511 Pain in right shoulder: Secondary | ICD-10-CM | POA: Diagnosis not present

## 2023-09-17 DIAGNOSIS — M7581 Other shoulder lesions, right shoulder: Secondary | ICD-10-CM | POA: Diagnosis not present

## 2023-09-24 DIAGNOSIS — R202 Paresthesia of skin: Secondary | ICD-10-CM | POA: Diagnosis not present

## 2023-09-24 DIAGNOSIS — M7581 Other shoulder lesions, right shoulder: Secondary | ICD-10-CM | POA: Diagnosis not present
